# Patient Record
Sex: Female | Born: 1985 | Race: White | Hispanic: Yes | Marital: Single | Smoking: Never smoker
Health system: Southern US, Community
[De-identification: ages and names within clinical notes are randomized; demographics above are authoritative.]

## PROBLEM LIST (undated history)

## (undated) DIAGNOSIS — I10 Essential (primary) hypertension: Secondary | ICD-10-CM

## (undated) DIAGNOSIS — E119 Type 2 diabetes mellitus without complications: Secondary | ICD-10-CM

## (undated) DIAGNOSIS — F419 Anxiety disorder, unspecified: Secondary | ICD-10-CM

## (undated) DIAGNOSIS — L0291 Cutaneous abscess, unspecified: Secondary | ICD-10-CM

---

## 2020-07-16 ENCOUNTER — Encounter (HOSPITAL_COMMUNITY): Payer: Self-pay

## 2020-07-16 ENCOUNTER — Ambulatory Visit (HOSPITAL_COMMUNITY): Admission: EM | Admit: 2020-07-16 | Discharge: 2020-07-16 | Discharge status: Against Medical Advice | Payer: 59

## 2020-07-16 ENCOUNTER — Ambulatory Visit (HOSPITAL_COMMUNITY)
Admission: RE | Admit: 2020-07-16 | Discharge: 2020-07-16 | Disposition: A | Discharge status: Home / Self Care | Payer: 59 | Source: Ambulatory Visit | Attending: Urgent Care | Admitting: Urgent Care

## 2020-07-16 ENCOUNTER — Inpatient Hospital Stay (HOSPITAL_COMMUNITY)
Admission: EM | Admit: 2020-07-16 | Discharge: 2020-07-19 | DRG: 854 | Disposition: A | Discharge status: Home / Self Care | Payer: 59 | Attending: Internal Medicine | Admitting: Internal Medicine

## 2020-07-16 ENCOUNTER — Other Ambulatory Visit: Payer: Self-pay

## 2020-07-16 VITALS — BP 163/100 | HR 106 | Temp 99.0°F | Resp 18

## 2020-07-16 DIAGNOSIS — I10 Essential (primary) hypertension: Secondary | ICD-10-CM | POA: Diagnosis present

## 2020-07-16 DIAGNOSIS — L02215 Cutaneous abscess of perineum: Secondary | ICD-10-CM | POA: Diagnosis present

## 2020-07-16 DIAGNOSIS — L732 Hidradenitis suppurativa: Secondary | ICD-10-CM | POA: Diagnosis present

## 2020-07-16 DIAGNOSIS — E119 Type 2 diabetes mellitus without complications: Secondary | ICD-10-CM

## 2020-07-16 DIAGNOSIS — M542 Cervicalgia: Secondary | ICD-10-CM

## 2020-07-16 DIAGNOSIS — A419 Sepsis, unspecified organism: Secondary | ICD-10-CM | POA: Diagnosis not present

## 2020-07-16 DIAGNOSIS — Z6841 Body Mass Index (BMI) 40.0 and over, adult: Secondary | ICD-10-CM

## 2020-07-16 DIAGNOSIS — L0211 Cutaneous abscess of neck: Secondary | ICD-10-CM | POA: Diagnosis not present

## 2020-07-16 DIAGNOSIS — Z20822 Contact with and (suspected) exposure to covid-19: Secondary | ICD-10-CM | POA: Diagnosis present

## 2020-07-16 DIAGNOSIS — L03221 Cellulitis of neck: Secondary | ICD-10-CM | POA: Diagnosis not present

## 2020-07-16 DIAGNOSIS — E1165 Type 2 diabetes mellitus with hyperglycemia: Secondary | ICD-10-CM | POA: Diagnosis present

## 2020-07-16 DIAGNOSIS — R739 Hyperglycemia, unspecified: Secondary | ICD-10-CM

## 2020-07-16 DIAGNOSIS — Z87891 Personal history of nicotine dependence: Secondary | ICD-10-CM

## 2020-07-16 DIAGNOSIS — E1121 Type 2 diabetes mellitus with diabetic nephropathy: Secondary | ICD-10-CM

## 2020-07-16 HISTORY — DX: Essential (primary) hypertension: I10

## 2020-07-16 HISTORY — DX: Type 2 diabetes mellitus without complications: E11.9

## 2020-07-16 HISTORY — DX: Cutaneous abscess, unspecified: L02.91

## 2020-07-16 LAB — COMPREHENSIVE METABOLIC PANEL
ALT: 15 U/L (ref 0–44)
AST: 13 U/L — ABNORMAL LOW (ref 15–41)
Albumin: 3.1 g/dL — ABNORMAL LOW (ref 3.5–5.0)
Alkaline Phosphatase: 56 U/L (ref 38–126)
Anion gap: 10 (ref 5–15)
BUN: 8 mg/dL (ref 6–20)
CO2: 25 mmol/L (ref 22–32)
Calcium: 8.8 mg/dL — ABNORMAL LOW (ref 8.9–10.3)
Chloride: 100 mmol/L (ref 98–111)
Creatinine, Ser: 0.71 mg/dL (ref 0.44–1.00)
GFR calc Af Amer: >60 mL/min (ref >60)
GFR calc non Af Amer: >60 mL/min (ref >60)
Glucose, Bld: 163 mg/dL — ABNORMAL HIGH (ref 70–99)
Potassium: 4 mmol/L (ref 3.5–5.1)
Sodium: 135 mmol/L (ref 135–145)
Total Bilirubin: 0.6 mg/dL (ref 0.3–1.2)
Total Protein: 9 g/dL — ABNORMAL HIGH (ref 6.5–8.1)

## 2020-07-16 LAB — CBC WITH DIFFERENTIAL/PLATELET
Abs Immature Granulocytes: 0.07 10*3/uL (ref 0.00–0.07)
Basophils Absolute: 0.1 10*3/uL (ref 0.0–0.1)
Basophils Relative: 0 %
Eosinophils Absolute: 0.1 10*3/uL (ref 0.0–0.5)
Eosinophils Relative: 0 %
HCT: 40.2 % (ref 36.0–46.0)
Hemoglobin: 12.8 g/dL (ref 12.0–15.0)
Immature Granulocytes: 0 %
Lymphocytes Relative: 15 %
Lymphs Abs: 2.6 10*3/uL (ref 0.7–4.0)
MCH: 28.2 pg (ref 26.0–34.0)
MCHC: 31.8 g/dL (ref 30.0–36.0)
MCV: 88.5 fL (ref 80.0–100.0)
Monocytes Absolute: 1.1 10*3/uL — ABNORMAL HIGH (ref 0.1–1.0)
Monocytes Relative: 6 %
Neutro Abs: 13.6 10*3/uL — ABNORMAL HIGH (ref 1.7–7.7)
Neutrophils Relative %: 79 %
Platelets: 461 10*3/uL — ABNORMAL HIGH (ref 150–400)
RBC: 4.54 MIL/uL (ref 3.87–5.11)
RDW: 13 % (ref 11.5–15.5)
WBC: 17.5 10*3/uL — ABNORMAL HIGH (ref 4.0–10.5)
nRBC: 0 % (ref 0.0–0.2)

## 2020-07-16 LAB — LACTIC ACID, PLASMA: Lactic Acid, Venous: 0.9 mmol/L (ref 0.5–1.9)

## 2020-07-16 LAB — I-STAT BETA HCG BLOOD, ED (MC, WL, AP ONLY): I-stat hCG, quantitative: <5 m[IU]/mL (ref <5)

## 2020-07-16 MED ORDER — HYDROCODONE-ACETAMINOPHEN 5-325 MG PO TABS
ORAL_TABLET | ORAL | Status: AC
  Filled 2020-07-16: qty 1

## 2020-07-16 MED ORDER — HYDROCODONE-ACETAMINOPHEN 5-325 MG PO TABS
1.0000 | ORAL_TABLET | Freq: Once | ORAL | Status: AC
  Administered 2020-07-16: 1 via ORAL

## 2020-07-16 NOTE — ED Triage Notes (Signed)
Patient c/o abscess on the back of the neck.

## 2020-07-16 NOTE — ED Notes (Signed)
Patient is being discharged from the Urgent Care and sent to the Emergency Department via POV . Per Wallis Bamberg, PA, patient is in need of higher level of care due to HTN, tachycardia, large abscess on back of neck. Patient is aware and verbalizes understanding of plan of care.  Vitals:   07/16/20 1606  BP: (!) 163/100  Pulse: (!) 106  Resp: 18  Temp: 99 F (37.2 C)  SpO2: 100%   \

## 2020-07-16 NOTE — ED Triage Notes (Signed)
Pt arrives POV for eval of R sided neck abscess and pilonidal cyst requiring drainage. Pt reports hx of same. Sent here from UC for eval. Given Norco at UC, and took 1500mg  of tylenol this AM, so given amt of tylenol taken today, no more given in triage for low grade fever.

## 2020-07-16 NOTE — ED Provider Notes (Signed)
  [facility]   MRN: [id number] DOB: 1986/03/19  Subjective:   Alexis Mathews is a 34 y.o. female presenting for 1 week history of recurrent worsening abscess of the back of her neck.  States that the swelling has become very prominent and the pain is moderate to severe, constant.  Patient has previously had to have a right-sided neck abscess surgically drained.  Has taken Tylenol with minimal relief.  No current facility-administered medications for this encounter. No current outpatient medications on file.   No Known Allergies  Past Medical History:  Diagnosis Date  . Abscess   . Hypertension      History reviewed. No pertinent surgical history.  History reviewed. No pertinent family history.  Social History   Tobacco Use  . Smoking status: Never Smoker  . Smokeless tobacco: Never Used  Vaping Use  . Vaping Use: Never used  Substance Use Topics  . Alcohol use: Yes  . Drug use: Not on file    ROS   Objective:   Vitals: BP (!) 163/100   Pulse (!) 106   Temp 99 F (37.2 C)   Resp 18   LMP 07/02/2020 (Within Days)   SpO2 100%   Physical Exam Constitutional:      General: She is not in acute distress.    Appearance: Normal appearance. She is well-developed. She is not ill-appearing, toxic-appearing or diaphoretic.  HENT:     Head: Normocephalic and atraumatic.     Nose: Nose normal.     Mouth/Throat:     Mouth: Mucous membranes are moist.     Pharynx: Oropharynx is clear.  Eyes:     General: No scleral icterus.       Right eye: No discharge.        Left eye: No discharge.     Extraocular Movements: Extraocular movements intact.     Conjunctiva/sclera: Conjunctivae normal.     Pupils: Pupils are equal, round, and reactive to light.  Neck:   Cardiovascular:     Rate and Rhythm: Normal rate.  Pulmonary:     Effort: Pulmonary effort is normal.  Skin:    General: Skin is warm and dry.  Neurological:     General: No focal deficit present.      Mental Status: She is alert and oriented to person, place, and time.  Psychiatric:        Mood and Affect: Mood normal.        Behavior: Behavior normal.        Thought Content: Thought content normal.        Judgment: Judgment normal.      Assessment and Plan :   PDMP not reviewed this encounter.  1. Neck abscess   2. Neck pain     She has a severe neck abscess, has history of requiring surgical drainage for similar problem.  Given her tachycardia, lack of fluctuance, severity of her abscess and history will redirect patient to the emergency room for further management.  Patient given hydrocodone in clinic to bridge her treatment in the ER.  Contracts for safety and will drive there now.   Wallis Bamberg, [place] 07/16/20 1628

## 2020-07-16 NOTE — Discharge Instructions (Addendum)
You have a severe neck abscess, tachycardia and an elevated blood pressure reading that will require a higher level of care than we can provide in the urgent care setting. Please report to the emergency room now for further management.

## 2020-07-17 ENCOUNTER — Encounter (HOSPITAL_COMMUNITY)
Admission: EM | Disposition: A | Discharge status: Home / Self Care | Payer: 59 | Source: Home / Self Care | Attending: Family Medicine

## 2020-07-17 ENCOUNTER — Encounter (HOSPITAL_COMMUNITY): Payer: Self-pay | Admitting: Family Medicine

## 2020-07-17 ENCOUNTER — Inpatient Hospital Stay (HOSPITAL_COMMUNITY): Payer: 59 | Admitting: Certified Registered"

## 2020-07-17 ENCOUNTER — Encounter (INDEPENDENT_AMBULATORY_CARE_PROVIDER_SITE_OTHER): Payer: Self-pay

## 2020-07-17 ENCOUNTER — Emergency Department (HOSPITAL_COMMUNITY): Payer: 59

## 2020-07-17 DIAGNOSIS — L03221 Cellulitis of neck: Secondary | ICD-10-CM | POA: Diagnosis present

## 2020-07-17 DIAGNOSIS — L02215 Cutaneous abscess of perineum: Secondary | ICD-10-CM | POA: Diagnosis present

## 2020-07-17 DIAGNOSIS — L732 Hidradenitis suppurativa: Secondary | ICD-10-CM | POA: Diagnosis present

## 2020-07-17 DIAGNOSIS — L0211 Cutaneous abscess of neck: Secondary | ICD-10-CM

## 2020-07-17 DIAGNOSIS — A419 Sepsis, unspecified organism: Secondary | ICD-10-CM | POA: Diagnosis present

## 2020-07-17 DIAGNOSIS — R739 Hyperglycemia, unspecified: Secondary | ICD-10-CM | POA: Diagnosis present

## 2020-07-17 DIAGNOSIS — Z6841 Body Mass Index (BMI) 40.0 and over, adult: Secondary | ICD-10-CM | POA: Diagnosis not present

## 2020-07-17 DIAGNOSIS — E1165 Type 2 diabetes mellitus with hyperglycemia: Secondary | ICD-10-CM | POA: Diagnosis present

## 2020-07-17 DIAGNOSIS — E119 Type 2 diabetes mellitus without complications: Secondary | ICD-10-CM

## 2020-07-17 DIAGNOSIS — I10 Essential (primary) hypertension: Secondary | ICD-10-CM | POA: Diagnosis present

## 2020-07-17 DIAGNOSIS — Z87891 Personal history of nicotine dependence: Secondary | ICD-10-CM | POA: Diagnosis not present

## 2020-07-17 DIAGNOSIS — Z20822 Contact with and (suspected) exposure to covid-19: Secondary | ICD-10-CM | POA: Diagnosis present

## 2020-07-17 HISTORY — PX: IRRIGATION AND DEBRIDEMENT ABSCESS: SHX5252

## 2020-07-17 HISTORY — PX: INCISION AND DRAINAGE PERIRECTAL ABSCESS: SHX1804

## 2020-07-17 LAB — COMPREHENSIVE METABOLIC PANEL
ALT: 12 U/L (ref 0–44)
AST: 13 U/L — ABNORMAL LOW (ref 15–41)
Albumin: 2.6 g/dL — ABNORMAL LOW (ref 3.5–5.0)
Alkaline Phosphatase: 49 U/L (ref 38–126)
Anion gap: 9 (ref 5–15)
BUN: 6 mg/dL (ref 6–20)
CO2: 26 mmol/L (ref 22–32)
Calcium: 8.3 mg/dL — ABNORMAL LOW (ref 8.9–10.3)
Chloride: 99 mmol/L (ref 98–111)
Creatinine, Ser: 0.67 mg/dL (ref 0.44–1.00)
GFR calc Af Amer: >60 mL/min (ref >60)
GFR calc non Af Amer: >60 mL/min (ref >60)
Glucose, Bld: 126 mg/dL — ABNORMAL HIGH (ref 70–99)
Potassium: 3.7 mmol/L (ref 3.5–5.1)
Sodium: 134 mmol/L — ABNORMAL LOW (ref 135–145)
Total Bilirubin: 0.8 mg/dL (ref 0.3–1.2)
Total Protein: 8.1 g/dL (ref 6.5–8.1)

## 2020-07-17 LAB — CREATININE, SERUM
Creatinine, Ser: 0.75 mg/dL (ref 0.44–1.00)
GFR calc Af Amer: >60 mL/min (ref >60)
GFR calc non Af Amer: >60 mL/min (ref >60)

## 2020-07-17 LAB — HIV ANTIBODY (ROUTINE TESTING W REFLEX): HIV Screen 4th Generation wRfx: NONREACTIVE

## 2020-07-17 LAB — CBC WITH DIFFERENTIAL/PLATELET
Abs Immature Granulocytes: 0.06 10*3/uL (ref 0.00–0.07)
Basophils Absolute: 0.1 10*3/uL (ref 0.0–0.1)
Basophils Relative: 0 %
Eosinophils Absolute: 0.1 10*3/uL (ref 0.0–0.5)
Eosinophils Relative: 1 %
HCT: 37.9 % (ref 36.0–46.0)
Hemoglobin: 12 g/dL (ref 12.0–15.0)
Immature Granulocytes: 0 %
Lymphocytes Relative: 20 %
Lymphs Abs: 3.2 10*3/uL (ref 0.7–4.0)
MCH: 28.3 pg (ref 26.0–34.0)
MCHC: 31.7 g/dL (ref 30.0–36.0)
MCV: 89.4 fL (ref 80.0–100.0)
Monocytes Absolute: 1.1 10*3/uL — ABNORMAL HIGH (ref 0.1–1.0)
Monocytes Relative: 7 %
Neutro Abs: 11.5 10*3/uL — ABNORMAL HIGH (ref 1.7–7.7)
Neutrophils Relative %: 72 %
Platelets: 429 10*3/uL — ABNORMAL HIGH (ref 150–400)
RBC: 4.24 MIL/uL (ref 3.87–5.11)
RDW: 13.2 % (ref 11.5–15.5)
WBC: 16 10*3/uL — ABNORMAL HIGH (ref 4.0–10.5)
nRBC: 0 % (ref 0.0–0.2)

## 2020-07-17 LAB — LACTIC ACID, PLASMA
Lactic Acid, Venous: 1.4 mmol/L (ref 0.5–1.9)
Lactic Acid, Venous: 1.1 mmol/L (ref 0.5–1.9)

## 2020-07-17 LAB — GLUCOSE, CAPILLARY
Glucose-Capillary: 130 mg/dL — ABNORMAL HIGH (ref 70–99)
Glucose-Capillary: 232 mg/dL — ABNORMAL HIGH (ref 70–99)

## 2020-07-17 LAB — HEMOGLOBIN A1C
Hgb A1c MFr Bld: 6.8 % — ABNORMAL HIGH (ref 4.8–5.6)
Mean Plasma Glucose: 148.46 mg/dL

## 2020-07-17 LAB — SARS CORONAVIRUS 2 BY RT PCR (HOSPITAL ORDER, PERFORMED IN ~~LOC~~ HOSPITAL LAB): SARS Coronavirus 2: NEGATIVE

## 2020-07-17 LAB — CBG MONITORING, ED
Glucose-Capillary: 127 mg/dL — ABNORMAL HIGH (ref 70–99)
Glucose-Capillary: 135 mg/dL — ABNORMAL HIGH (ref 70–99)

## 2020-07-17 LAB — MAGNESIUM: Magnesium: 1.7 mg/dL (ref 1.7–2.4)

## 2020-07-17 SURGERY — IRRIGATION AND DEBRIDEMENT ABSCESS
Anesthesia: General | Site: Neck

## 2020-07-17 MED ORDER — SUCCINYLCHOLINE CHLORIDE 20 MG/ML IJ SOLN
INTRAMUSCULAR | Status: DC | PRN
Start: 2020-07-17 — End: 2020-07-17
  Administered 2020-07-17: 200 mg via INTRAVENOUS

## 2020-07-17 MED ORDER — ACETAMINOPHEN 500 MG PO TABS
1000.0000 mg | ORAL_TABLET | Freq: Once | ORAL | Status: AC
  Administered 2020-07-17: 1000 mg via ORAL
  Filled 2020-07-17: qty 2

## 2020-07-17 MED ORDER — LACTATED RINGERS IV SOLN: INTRAVENOUS | Status: DC

## 2020-07-17 MED ORDER — LIDOCAINE 2% (20 MG/ML) 5 ML SYRINGE
INTRAMUSCULAR | Status: AC
  Filled 2020-07-17: qty 5

## 2020-07-17 MED ORDER — INSULIN ASPART 100 UNIT/ML ~~LOC~~ SOLN: 0.0000 [IU] | Freq: Three times a day (TID) | SUBCUTANEOUS | Status: DC

## 2020-07-17 MED ORDER — MORPHINE SULFATE (PF) 4 MG/ML IV SOLN
4.0000 mg | Freq: Once | INTRAVENOUS | Status: AC
  Administered 2020-07-17: 4 mg via INTRAVENOUS
  Filled 2020-07-17: qty 1

## 2020-07-17 MED ORDER — OXYCODONE HCL 5 MG PO TABS
5.0000 mg | ORAL_TABLET | ORAL | Status: DC | PRN
  Administered 2020-07-19: 5 mg via ORAL
  Filled 2020-07-17: qty 1

## 2020-07-17 MED ORDER — ACETAMINOPHEN 650 MG RE SUPP: 650.0000 mg | Freq: Four times a day (QID) | RECTAL | Status: DC | PRN

## 2020-07-17 MED ORDER — ROCURONIUM BROMIDE 10 MG/ML (PF) SYRINGE
PREFILLED_SYRINGE | INTRAVENOUS | Status: DC | PRN
  Administered 2020-07-17 (×2): 20 mg via INTRAVENOUS

## 2020-07-17 MED ORDER — LIDOCAINE 2% (20 MG/ML) 5 ML SYRINGE
INTRAMUSCULAR | Status: DC | PRN
  Administered 2020-07-17: 100 mg via INTRAVENOUS

## 2020-07-17 MED ORDER — POLYETHYLENE GLYCOL 3350 17 G PO PACK: 17.0000 g | PACK | Freq: Every day | ORAL | Status: DC | PRN

## 2020-07-17 MED ORDER — MORPHINE SULFATE (PF) 2 MG/ML IV SOLN: 2.0000 mg | INTRAVENOUS | Status: DC | PRN

## 2020-07-17 MED ORDER — SUCCINYLCHOLINE CHLORIDE 200 MG/10ML IV SOSY
PREFILLED_SYRINGE | INTRAVENOUS | Status: AC
  Filled 2020-07-17: qty 10

## 2020-07-17 MED ORDER — SODIUM CHLORIDE 0.9 % IV BOLUS
500.0000 mL | Freq: Once | INTRAVENOUS | Status: AC
  Administered 2020-07-17: 500 mL via INTRAVENOUS

## 2020-07-17 MED ORDER — FENTANYL CITRATE (PF) 250 MCG/5ML IJ SOLN
INTRAMUSCULAR | Status: AC
  Filled 2020-07-17: qty 5

## 2020-07-17 MED ORDER — HEPARIN SODIUM (PORCINE) 5000 UNIT/ML IJ SOLN
5000.0000 [IU] | Freq: Three times a day (TID) | INTRAMUSCULAR | Status: DC
  Administered 2020-07-17: 5000 [IU] via SUBCUTANEOUS
  Filled 2020-07-17: qty 1

## 2020-07-17 MED ORDER — ONDANSETRON HCL 4 MG/2ML IJ SOLN: 4.0000 mg | Freq: Four times a day (QID) | INTRAMUSCULAR | Status: DC | PRN

## 2020-07-17 MED ORDER — HEPARIN SODIUM (PORCINE) 5000 UNIT/ML IJ SOLN
5000.0000 [IU] | Freq: Three times a day (TID) | INTRAMUSCULAR | Status: DC
  Administered 2020-07-18 – 2020-07-19 (×5): 5000 [IU] via SUBCUTANEOUS
  Filled 2020-07-17 (×5): qty 1

## 2020-07-17 MED ORDER — PROPOFOL 10 MG/ML IV BOLUS
INTRAVENOUS | Status: DC | PRN
  Administered 2020-07-17: 200 mg via INTRAVENOUS

## 2020-07-17 MED ORDER — METFORMIN HCL 500 MG PO TABS
500.0000 mg | ORAL_TABLET | Freq: Every day | ORAL | Status: DC
  Administered 2020-07-18 – 2020-07-19 (×2): 500 mg via ORAL
  Filled 2020-07-17 (×2): qty 1

## 2020-07-17 MED ORDER — 0.9 % SODIUM CHLORIDE (POUR BTL) OPTIME
TOPICAL | Status: DC | PRN
  Administered 2020-07-17: 1000 mL

## 2020-07-17 MED ORDER — LACTATED RINGERS IV BOLUS (SEPSIS)
600.0000 mL | Freq: Once | INTRAVENOUS | Status: AC
  Administered 2020-07-17: 600 mL via INTRAVENOUS

## 2020-07-17 MED ORDER — LACTATED RINGERS IV SOLN
INTRAVENOUS | Status: DC | PRN
Start: 2020-07-17 — End: 2020-07-17

## 2020-07-17 MED ORDER — CHLORHEXIDINE GLUCONATE 0.12 % MT SOLN
15.0000 mL | Freq: Once | OROMUCOSAL | Status: AC
  Administered 2020-07-17: 15 mL via OROMUCOSAL
  Filled 2020-07-17: qty 15

## 2020-07-17 MED ORDER — SODIUM CHLORIDE 0.9 % IV SOLN
2.0000 g | Freq: Once | INTRAVENOUS | Status: AC
  Administered 2020-07-17: 2 g via INTRAVENOUS
  Filled 2020-07-17: qty 20

## 2020-07-17 MED ORDER — MIDAZOLAM HCL 2 MG/2ML IJ SOLN
INTRAMUSCULAR | Status: AC
  Filled 2020-07-17: qty 2

## 2020-07-17 MED ORDER — ONDANSETRON HCL 4 MG/2ML IJ SOLN
INTRAMUSCULAR | Status: AC
  Filled 2020-07-17: qty 2

## 2020-07-17 MED ORDER — VANCOMYCIN HCL IN DEXTROSE 1-5 GM/200ML-% IV SOLN
1000.0000 mg | Freq: Once | INTRAVENOUS | Status: DC
  Filled 2020-07-17: qty 200

## 2020-07-17 MED ORDER — GLYCOPYRROLATE PF 0.2 MG/ML IJ SOSY
PREFILLED_SYRINGE | INTRAMUSCULAR | Status: AC
  Filled 2020-07-17: qty 1

## 2020-07-17 MED ORDER — VANCOMYCIN HCL IN DEXTROSE 1-5 GM/200ML-% IV SOLN: 1000.0000 mg | Freq: Once | INTRAVENOUS | Status: DC

## 2020-07-17 MED ORDER — FENTANYL CITRATE (PF) 100 MCG/2ML IJ SOLN
INTRAMUSCULAR | Status: DC | PRN
  Administered 2020-07-17 (×3): 50 ug via INTRAVENOUS
  Administered 2020-07-17: 100 ug via INTRAVENOUS

## 2020-07-17 MED ORDER — DEXAMETHASONE SODIUM PHOSPHATE 10 MG/ML IJ SOLN
INTRAMUSCULAR | Status: DC | PRN
  Administered 2020-07-17: 4 mg via INTRAVENOUS

## 2020-07-17 MED ORDER — HYDRALAZINE HCL 20 MG/ML IJ SOLN: 10.0000 mg | INTRAMUSCULAR | Status: DC | PRN

## 2020-07-17 MED ORDER — ONDANSETRON HCL 4 MG PO TABS: 4.0000 mg | ORAL_TABLET | Freq: Four times a day (QID) | ORAL | Status: DC | PRN

## 2020-07-17 MED ORDER — DEXAMETHASONE SODIUM PHOSPHATE 10 MG/ML IJ SOLN
INTRAMUSCULAR | Status: AC
  Filled 2020-07-17: qty 1

## 2020-07-17 MED ORDER — PROPOFOL 10 MG/ML IV BOLUS
INTRAVENOUS | Status: AC
  Filled 2020-07-17: qty 20

## 2020-07-17 MED ORDER — PHENYLEPHRINE 40 MCG/ML (10ML) SYRINGE FOR IV PUSH (FOR BLOOD PRESSURE SUPPORT)
PREFILLED_SYRINGE | INTRAVENOUS | Status: AC
  Filled 2020-07-17: qty 10

## 2020-07-17 MED ORDER — VANCOMYCIN HCL IN DEXTROSE 1-5 GM/200ML-% IV SOLN
1000.0000 mg | Freq: Three times a day (TID) | INTRAVENOUS | Status: DC
  Administered 2020-07-17 – 2020-07-19 (×6): 1000 mg via INTRAVENOUS
  Filled 2020-07-17 (×9): qty 200

## 2020-07-17 MED ORDER — MORPHINE SULFATE (PF) 2 MG/ML IV SOLN
1.0000 mg | INTRAVENOUS | Status: DC | PRN
  Administered 2020-07-18: 2 mg via INTRAVENOUS
  Filled 2020-07-17 (×2): qty 1

## 2020-07-17 MED ORDER — IOHEXOL 300 MG/ML  SOLN
75.0000 mL | Freq: Once | INTRAMUSCULAR | Status: AC | PRN
  Administered 2020-07-17: 75 mL via INTRAVENOUS

## 2020-07-17 MED ORDER — VANCOMYCIN HCL 2000 MG/400ML IV SOLN
2000.0000 mg | Freq: Once | INTRAVENOUS | Status: AC
  Administered 2020-07-17: 2000 mg via INTRAVENOUS
  Filled 2020-07-17: qty 400

## 2020-07-17 MED ORDER — MIDAZOLAM HCL 5 MG/5ML IJ SOLN
INTRAMUSCULAR | Status: DC | PRN
  Administered 2020-07-17: 2 mg via INTRAVENOUS

## 2020-07-17 MED ORDER — SUGAMMADEX SODIUM 200 MG/2ML IV SOLN
INTRAVENOUS | Status: DC | PRN
Start: 2020-07-17 — End: 2020-07-17
  Administered 2020-07-17: 100 mg via INTRAVENOUS

## 2020-07-17 MED ORDER — ROCURONIUM BROMIDE 10 MG/ML (PF) SYRINGE
PREFILLED_SYRINGE | INTRAVENOUS | Status: AC
  Filled 2020-07-17: qty 10

## 2020-07-17 MED ORDER — ACETAMINOPHEN 325 MG PO TABS
650.0000 mg | ORAL_TABLET | Freq: Three times a day (TID) | ORAL | Status: DC
  Administered 2020-07-17 – 2020-07-19 (×6): 650 mg via ORAL
  Filled 2020-07-17 (×6): qty 2

## 2020-07-17 MED ORDER — LACTATED RINGERS IV SOLN: INTRAVENOUS | Status: AC

## 2020-07-17 MED ORDER — LACTATED RINGERS IV BOLUS (SEPSIS)
1000.0000 mL | Freq: Once | INTRAVENOUS | Status: AC
  Administered 2020-07-17: 1000 mL via INTRAVENOUS

## 2020-07-17 MED ORDER — HEMOSTATIC AGENTS (NO CHARGE) OPTIME
TOPICAL | Status: DC | PRN
  Administered 2020-07-17 (×2): 1 via TOPICAL

## 2020-07-17 MED ORDER — ONDANSETRON HCL 4 MG/2ML IJ SOLN
INTRAMUSCULAR | Status: DC | PRN
  Administered 2020-07-17: 4 mg via INTRAVENOUS

## 2020-07-17 MED ORDER — ACETAMINOPHEN 325 MG PO TABS: 650.0000 mg | ORAL_TABLET | Freq: Four times a day (QID) | ORAL | Status: DC | PRN

## 2020-07-17 SURGICAL SUPPLY — 34 items
BNDG GAUZE ELAST 4 BULKY (GAUZE/BANDAGES/DRESSINGS) IMPLANT
CANISTER SUCT 3000ML PPV (MISCELLANEOUS) ×4 IMPLANT
COVER SURGICAL LIGHT HANDLE (MISCELLANEOUS) ×6 IMPLANT
COVER WAND RF STERILE (DRAPES) ×4 IMPLANT
DRAIN PENROSE 1/4X12 LTX STRL (WOUND CARE) ×2 IMPLANT
DRAPE LAPAROSCOPIC ABDOMINAL (DRAPES) ×4 IMPLANT
DRSG PAD ABDOMINAL 8X10 ST (GAUZE/BANDAGES/DRESSINGS) ×6 IMPLANT
ELECT REM PT RETURN 9FT ADLT (ELECTROSURGICAL) ×4
ELECTRODE REM PT RTRN 9FT ADLT (ELECTROSURGICAL) ×2 IMPLANT
GAUZE PACKING IODOFORM 1X5 (PACKING) ×4 IMPLANT
GAUZE SPONGE 4X4 12PLY STRL (GAUZE/BANDAGES/DRESSINGS) ×4 IMPLANT
GLOVE BIO SURGEON STRL SZ 6 (GLOVE) ×2 IMPLANT
GLOVE BIO SURGEON STRL SZ8 (GLOVE) ×4 IMPLANT
GLOVE BIOGEL PI IND STRL 8 (GLOVE) ×2 IMPLANT
GLOVE BIOGEL PI INDICATOR 8 (GLOVE) ×2
GOWN STRL REUS W/ TWL LRG LVL3 (GOWN DISPOSABLE) ×2 IMPLANT
GOWN STRL REUS W/ TWL XL LVL3 (GOWN DISPOSABLE) ×2 IMPLANT
GOWN STRL REUS W/TWL LRG LVL3 (GOWN DISPOSABLE) ×2
GOWN STRL REUS W/TWL XL LVL3 (GOWN DISPOSABLE) ×4
HEMOSTAT ARISTA ABSORB 3G PWDR (HEMOSTASIS) ×2 IMPLANT
KIT BASIN OR (CUSTOM PROCEDURE TRAY) ×4 IMPLANT
KIT TURNOVER KIT B (KITS) ×4 IMPLANT
MARKER SKIN DUAL TIP RULER LAB (MISCELLANEOUS) ×2 IMPLANT
NS IRRIG 1000ML POUR BTL (IV SOLUTION) ×4 IMPLANT
PACK GENERAL/GYN (CUSTOM PROCEDURE TRAY) ×4 IMPLANT
PAD ARMBOARD 7.5X6 YLW CONV (MISCELLANEOUS) ×4 IMPLANT
PENCIL SMOKE EVACUATOR (MISCELLANEOUS) ×4 IMPLANT
SUT ETHILON 2 0 FS 18 (SUTURE) ×2 IMPLANT
SWAB COLLECTION DEVICE MRSA (MISCELLANEOUS) ×4 IMPLANT
SWAB CULTURE ESWAB REG 1ML (MISCELLANEOUS) ×4 IMPLANT
SYR BULB EAR ULCER 3OZ GRN STR (SYRINGE) ×2 IMPLANT
SYR CONTROL 10ML LL (SYRINGE) ×2 IMPLANT
TOWEL GREEN STERILE (TOWEL DISPOSABLE) ×4 IMPLANT
TOWEL GREEN STERILE FF (TOWEL DISPOSABLE) ×4 IMPLANT

## 2020-07-17 NOTE — H&P (View-Only) (Signed)
Reason for Consult/Chief Complaint: abscess Consultant: Muthersbaugh, PA  Alexis Mathews is an 34 y.o. female.   HPI: 37F with a neck and groin abscess, both present x1 week causing her to present to [facility] 07/16/2020. Denies any treatment strategies at home, no antibiotics, no warm soaks, no efforts to attempt drainage. Reports spontaneous drainage of the groin abscess began while here in the ED. Reports prior history of abscesses, the neck location required in-office drainage at [facility] by Dr. Luisa Hart, the groin location always managed with conservative therapy at home. Denies medical problems, but documentation in the chart reflects a history of hypertension and she has been as high as 180s systolic while here in the ED. Reports a history of social smoking, stopped 10-15 years ago.   Past Medical History:  Diagnosis Date  . Abscess   . Hypertension     History reviewed. No pertinent surgical history.  History reviewed. No pertinent family history.  Social History:  reports that she has never smoked. She has never used smokeless tobacco. She reports current alcohol use. No history on file for drug use.  Allergies: No Known Allergies  Medications: I have reviewed the patient's current medications.  Results for orders placed or performed during the hospital encounter of 07/16/20 (from the past 48 hour(s))  Lactic acid, plasma     Status: None   Collection Time: 07/16/20  6:17 PM  Result Value Ref Range   Lactic Acid, Venous 0.9 0.5 - 1.9 mmol/L    Comment: Performed at [facility], [address] [place], [place] [address]  Comprehensive metabolic panel     Status: Abnormal   Collection Time: 07/16/20  6:17 PM  Result Value Ref Range   Sodium 135 135 - 145 mmol/L   Potassium 4.0 3.5 - 5.1 mmol/L   Chloride 100 98 - 111 mmol/L   CO2 25 22 - 32 mmol/L   Glucose, Bld 163 (H) 70 - 99 mg/dL    Comment: Glucose reference range applies only to samples taken after fasting for at least  8 hours.   BUN 8 6 - 20 mg/dL   Creatinine, Ser 1.51 0.44 - 1.00 mg/dL   Calcium 8.8 (L) 8.9 - 10.3 mg/dL   Total Protein 9.0 (H) 6.5 - 8.1 g/dL   Albumin 3.1 (L) 3.5 - 5.0 g/dL   AST 13 (L) 15 - 41 U/L   ALT 15 0 - 44 U/L   Alkaline Phosphatase 56 38 - 126 U/L   Total Bilirubin 0.6 0.3 - 1.2 mg/dL   GFR calc non Af Amer >60 >60 mL/min   GFR calc Af Amer >60 >60 mL/min   Anion gap 10 5 - 15    Comment: Performed at [facility], [address], [place], [place] [address]  CBC with Differential     Status: Abnormal   Collection Time: 07/16/20  6:17 PM  Result Value Ref Range   WBC 17.5 (H) 4.0 - 10.5 K/uL   RBC 4.54 3.87 - 5.11 MIL/uL   Hemoglobin 12.8 12.0 - 15.0 g/dL   HCT 73.7 36 - 46 %   MCV 88.5 80.0 - 100.0 fL   MCH 28.2 26.0 - 34.0 pg   MCHC 31.8 30.0 - 36.0 g/dL   RDW 10.6 26.9 - 48.5 %   Platelets 461 (H) 150 - 400 K/uL   nRBC 0.0 0.0 - 0.2 %   Neutrophils Relative % 79 %   Neutro Abs 13.6 (H)  1.7 - 7.7 K/uL   Lymphocytes Relative 15 %   Lymphs Abs 2.6 0.7 - 4.0 K/uL   Monocytes Relative 6 %   Monocytes Absolute 1.1 (H) 0 - 1 K/uL   Eosinophils Relative 0 %   Eosinophils Absolute 0.1 0 - 0 K/uL   Basophils Relative 0 %   Basophils Absolute 0.1 0 - 0 K/uL   Immature Granulocytes 0 %   Abs Immature Granulocytes 0.07 0.00 - 0.07 K/uL    Comment: Performed at [facility], [address] [place], [place] [address]  I-Stat beta hCG blood, ED     Status: None   Collection Time: 07/16/20  6:45 PM  Result Value Ref Range   I-stat hCG, quantitative <5.0 <5 mIU/mL   Comment 3            Comment:   GEST. AGE      CONC.  (mIU/mL)   <=1 WEEK        5 - 50     2 WEEKS       50 - 500     3 WEEKS       100 - 10,000     4 WEEKS     1,000 - 30,000        FEMALE AND NON-PREGNANT FEMALE:     LESS THAN 5 mIU/mL   Lactic acid, plasma     Status: None   Collection Time: 07/17/20  2:00 AM  Result Value Ref Range   Lactic Acid, Venous 1.4 0.5 - 1.9 mmol/L     Comment: Performed at [facility], [address] [place], [place] [address]  SARS Coronavirus 2 by RT PCR (hospital order, performed in [facility] hospital lab) Nasopharyngeal Nasopharyngeal Swab     Status: None   Collection Time: 07/17/20  3:40 AM   Specimen: Nasopharyngeal Swab  Result Value Ref Range   SARS Coronavirus 2 NEGATIVE NEGATIVE    Comment: (NOTE) SARS-CoV-2 target nucleic acids are NOT DETECTED.  The SARS-CoV-2 RNA is generally detectable in upper and lower respiratory specimens during the acute phase of infection. The lowest concentration of SARS-CoV-2 viral copies this assay can detect is 250 copies / mL. A negative result does not preclude SARS-CoV-2 infection and should not be used as the sole basis for treatment or other patient management decisions.  A negative result may occur with improper specimen collection / handling, submission of specimen other than nasopharyngeal swab, presence of viral mutation(s) within the areas targeted by this assay, and inadequate number of viral copies (<250 copies / mL). A negative result must be combined with clinical observations, patient history, and epidemiological information.  Fact Sheet for Patients:   BoilerBrush.com.cy  Fact Sheet for Healthcare Providers: https://pope.com/  This test is not yet approved or  cleared by the [place] FDA and has been authorized for detection and/or diagnosis of SARS-CoV-2 by FDA under an Emergency Use Authorization (EUA).  This EUA will remain in effect (meaning this test can be used) for the duration of the COVID-19 declaration under Section 564(b)(1) of the Act, 21 U.S.C. section 360bbb-3(b)(1), unless the authorization is terminated or revoked sooner.  Performed at [facility], [address] [place], [place] [address]     CT Soft Tissue Neck W Contrast  Result Date: 07/17/2020 CLINICAL DATA:  Initial  evaluation for right-sided neck abscess. EXAM: CT NECK WITH CONTRAST TECHNIQUE: Multidetector CT imaging of the neck was performed using  the standard protocol following the bolus administration of intravenous contrast. CONTRAST:  14mL OMNIPAQUE IOHEXOL 300 MG/ML  SOLN COMPARISON:  None. FINDINGS: Pharynx and larynx: Oral cavity within normal limits. Few scattered dental caries noted without acute inflammatory changes about the teeth. Palatine tonsils symmetric and within normal limits. Parapharyngeal fat maintained. Remainder of the oropharynx and nasopharynx within normal limits. No retropharyngeal collection. Normal epiglottis. Remainder of the hypopharynx and supraglottic larynx within normal limits. True cords symmetric and normal. Subglottic airway clear. Salivary glands: Salivary glands including the parotid and submandibular glands are normal. Thyroid: Normal. Lymph nodes: Enlarged bilateral posterior chain/level 5 lymph nodes noted bilaterally, measuring up to 15 mm on the right and 14 mm on the left. Prominent supraclavicular nodes measure up to 10 mm. Findings presumably reactive. No other adenopathy within the neck. Vascular: Normal intravascular enhancement seen within the neck. Limited intracranial: Unremarkable. Visualized orbits: Unremarkable. Mastoids and visualized paranasal sinuses: Visualized paranasal sinuses are largely clear. Visualize mastoids and middle ear cavities are well pneumatized and free of fluid. Skeleton: No acute osseous finding. No discrete or worrisome osseous lesions. Upper chest: Visualized upper chest demonstrates no acute finding. Partially visualized lungs are grossly clear. Other: There is an irregular multiloculated rim enhancing collection positioned within the central and right posterior neck, consistent with abscess. Collection measures 8.5 x 4.9 x 4.2 cm in greatest transaxial dimensions (transverse by AP by craniocaudad). Surrounding inflammatory stranding within the  adjacent subcutaneous fat with overlying skin thickening, compatible with regional cellulitis. Stranding involves the underlying posterior paraspinous musculature. No extension into the deeper spaces of the neck or spinal canal at this time. IMPRESSION: 1. 8.5 x 4.9 x 4.2 cm multiloculated rim enhancing collection within the central and right posterior neck, consistent with abscess. Surrounding inflammatory stranding within the adjacent subcutaneous fat consistent with regional cellulitis. 2. Enlarged bilateral posterior chain/level 5 lymph nodes, presumably reactive. Electronically Signed   By: Rise Mu M.D.   On: 07/17/2020 02:43    ROS 10 point review of systems is negative except as listed above in HPI.   Physical Exam Blood pressure (!) 161/86, pulse 90, temperature 99.5 F (37.5 C), temperature source Oral, resp. rate 18, height 5\' 3"  (1.6 m), weight (!) 129.3 kg, last menstrual period 07/02/2020, SpO2 94 %. Constitutional: well-developed, well-nourished HEENT: pupils equal, round, reactive to light, 80mm b/l, moist conjunctiva, external inspection of ears and nose normal, hearing intact Oropharynx: normal oropharyngeal mucosa, normal dentition Neck: no thyromegaly, trachea midline, no midline cervical tenderness to palpation Chest: breath sounds equal bilaterally, normal respiratory effort, no midline or lateral chest wall tenderness to palpation/deformity Abdomen: soft, NT, no bruising, no hepatosplenomegaly GU: normal female genitalia  Back: no wounds, no thoracic/lumbar spine tenderness to palpation, no thoracic/lumbar spine stepoffs Rectal: deferred Extremities: 2+ radial and pedal pulses bilaterally, motor and sensation intact to bilateral UE and LE, no peripheral edema MSK: normal gait/station, no clubbing/cyanosis of fingers/toes, normal ROM of all four extremities Skin: warm, dry, no rashes Psych: normal memory, normal mood/affect    Assessment/Plan: 41F with neck  and L groin abscesses  Will likely need OR for drainage of neck abscess and EUA with probable drainage of L groin abscess, but will defer to day team. Informed consent was obtained after detailed explanation of risks, including bleeding, infection, risk of injury to surrounding structures, recurrence, and incomplete drainage. All questions answered to the patient's satisfaction.   3m, MD General and Trauma Surgery [facility]

## 2020-07-17 NOTE — ED Notes (Signed)
Op-consent signed at bedside.  

## 2020-07-17 NOTE — Anesthesia Preprocedure Evaluation (Signed)
Anesthesia Evaluation  Patient identified by MRN, date of birth, ID band Patient awake    Reviewed: Allergy & Precautions, NPO status , Patient's Chart, lab work & pertinent test results  History of Anesthesia Complications Negative for: history of anesthetic complications  Airway Mallampati: II  TM Distance: >3 FB Neck ROM: Full    Dental  (+) Teeth Intact   Pulmonary neg pulmonary ROS,    Pulmonary exam normal        Cardiovascular hypertension, Normal cardiovascular exam     Neuro/Psych negative neurological ROS  negative psych ROS   GI/Hepatic negative GI ROS, Neg liver ROS,   Endo/Other  diabetes (new diagnosis, untreated)Morbid obesity  Renal/GU negative Renal ROS  negative genitourinary   Musculoskeletal negative musculoskeletal ROS (+)   Abdominal   Peds  Hematology negative hematology ROS (+)   Anesthesia Other Findings   Reproductive/Obstetrics                             Anesthesia Physical Anesthesia Plan  ASA: III  Anesthesia Plan: General   Post-op Pain Management:    Induction: Intravenous  PONV Risk Score and Plan: 3 and Ondansetron, Dexamethasone, Treatment may vary due to age or medical condition and Midazolam  Airway Management Planned: Oral ETT  Additional Equipment: None  Intra-op Plan:   Post-operative Plan: Extubation in OR  Informed Consent: I have reviewed the patients History and Physical, chart, labs and discussed the procedure including the risks, benefits and alternatives for the proposed anesthesia with the patient or authorized representative who has indicated his/her understanding and acceptance.     Dental advisory given  Plan Discussed with:   Anesthesia Plan Comments:         Anesthesia Quick Evaluation

## 2020-07-17 NOTE — Anesthesia Procedure Notes (Signed)
Procedure Name: Intubation Date/Time: 07/17/2020 4:23 PM Performed by: Moshe Salisbury, CRNA Pre-anesthesia Checklist: Patient identified, Emergency Drugs available, Suction available and Patient being monitored Patient Re-evaluated:Patient Re-evaluated prior to induction Oxygen Delivery Method: Circle System Utilized Preoxygenation: Pre-oxygenation with 100% oxygen Induction Type: IV induction Ventilation: Mask ventilation without difficulty Laryngoscope Size: Mac and 3 Grade View: Grade II Tube type: Oral Tube size: 7.5 mm Number of attempts: 1 Airway Equipment and Method: Stylet Placement Confirmation: ETT inserted through vocal cords under direct vision,  positive ETCO2 and breath sounds checked- equal and bilateral Secured at: 22 cm Tube secured with: Tape Dental Injury: Teeth and Oropharynx as per pre-operative assessment

## 2020-07-17 NOTE — Transfer of Care (Signed)
Immediate Anesthesia Transfer of Care Note  Patient: Alexis Mathews  Procedure(s) Performed: IRRIGATION AND DEBRIDEMENT NECK ABSCESS (N/A Neck) IRRIGATION AND DEBRIDEMENT GROIN ABSCESS (N/A Groin)  Patient Location: PACU  Anesthesia Type:General  Level of Consciousness: drowsy and patient cooperative  Airway & Oxygen Therapy: Patient Spontanous Breathing and Patient connected to nasal cannula oxygen  Post-op Assessment: Report given to RN, Post -op Vital signs reviewed and stable and Patient moving all extremities  Post vital signs: Reviewed and stable  Last Vitals:  Vitals Value Taken Time  BP 140/79 07/17/20 1745  Temp 36.1 C 07/17/20 1745  Pulse 100 07/17/20 1750  Resp 32 07/17/20 1750  SpO2 100 % 07/17/20 1750  Vitals shown include unvalidated device data.  Last Pain:  Vitals:   07/17/20 1745  TempSrc:   PainSc: (P) 0-No pain         Complications: No complications documented.

## 2020-07-17 NOTE — Progress Notes (Signed)
Pharmacy Antibiotic Note  Alexis Mathews is a 34 y.o. female admitted on 07/16/2020 with neck abscess.  Pharmacy has been consulted for Vancomycin dosing. CT confirms abscess.   Plan: Vancomycin 2000 mg IV x 1, then 1000 mg IV q8h Trend WBC, temp, renal function  F/U infectious work-up Drug levels as indicated   Height: 5\' 3"  (160 cm) Weight: (!) 129.3 kg (285 lb) IBW/kg (Calculated) : 52.4  Temp (24hrs), Avg:99.2 F (37.3 C), Min:98.2 F (36.8 C), Max:100.7 F (38.2 C)  Recent Labs  Lab 07/16/20 1817 07/17/20 0200  WBC 17.5*  --   CREATININE 0.71  --   LATICACIDVEN 0.9 1.4    Estimated Creatinine Clearance: 128.9 mL/min (by C-G formula based on SCr of 0.71 mg/dL).    No Known Allergies  09/16/20, PharmD, BCPS Clinical Pharmacist Phone: [phone]

## 2020-07-17 NOTE — Progress Notes (Signed)
Communication took place with bedside  RN, RN and MD are aware of BC and Abx timing. Lactic normal, HTN. Pt in ED again with similiar adm diagnosis.

## 2020-07-17 NOTE — Brief Op Note (Signed)
07/17/2020  5:27 PM  PATIENT:  Alexis Mathews  34 y.o. female  PRE-OPERATIVE DIAGNOSIS:  RIGHT POSTERIOR NECK ABSCESS, LEFT PERINEAL ABSCESS  POST-OPERATIVE DIAGNOSIS:   SAME, HIDRADENITIS OF PERINEUM  PROCEDURE:  Procedure(s): INCISION AND DRAINAGE AND EXCISIONAL DEBRIDEMENT OF RIGHT POSTERIOR NECK ABSCESS WITH SCALPEL  INCISION AND DRAINAGE OF LEFT PERINEAL ABSCESS/HIDRADENITIS   SURGEON:  Surgeon(s) and Role:    * Gaynelle Adu, MD - Primary  PHYSICIAN ASSISTANT:   ASSISTANTS: none   ANESTHESIA:   general  EBL:  <30CC  BLOOD ADMINISTERED:none  DRAINS: Penrose drain in the RIGHT POSTERIOR NECK ABSCESS CAVITY     Tool used for debridement (curette, scapel, etc.) scalpel    Frequency of surgical debridement.   Initial   Measurement of total devitalized tissue (wound surface) before and after surgical debridement.   There is no open wound in either location at the beginning of the procedure.  Right posterior neck wound measured 4 cm long by 2 cm wide by 5 cm deep.  It did tunnel medially as well as cephalad for about 4 cm in both directions.  The left perineal wound measured 3 cm long by about 2 cm deep.    Area and depth of devitalized tissue removed from wound.  In the right posterior neck skin, deep dermis and soft tissue were excised.  Wound dimensions above.    Blood loss and description of tissue removed.  30 cc.  In the right neck skin, deep dermis and soft tissue was excised.  In the left perineal region there was no skin that was excised.  There was about 2 cm of soft tissue that was necrotic that was excised   Was there any viable tissue removed (measurements): In the right neck there was an area of 4 cm of skin by 2 cm that was excised that was viable in order to get down to the abscess cavity    LOCAL MEDICATIONS USED:  NONE  SPECIMEN:  Source of Specimen:  RIGHT POSTERIOR NECK ABSCESS; LEFT PERINEAL ABSCESS  DISPOSITION OF SPECIMEN:  MICRO  COUNTS:   YES  TOURNIQUET:  * No tourniquets in log *  DICTATION: .Other Dictation: Dictation Number [id number]  PLAN OF CARE: Admit to inpatient   PATIENT DISPOSITION:  PACU - hemodynamically stable.   Delay start of Pharmacological VTE agent (>24hrs) due to surgical blood loss or risk of bleeding: no  [name]. Andrey Campanile, MD, FACS General, Bariatric, & Minimally Invasive Surgery [facility], [place]

## 2020-07-17 NOTE — Interval H&P Note (Signed)
History and Physical Interval Note:  07/17/2020 9:01 AM  Alexis Mathews  has presented today for surgery, with the diagnosis of NECK ABCESS.  The various methods of treatment have been discussed with the patient and family. After consideration of risks, benefits and other options for treatment, the patient has consented to  Procedure(s): IRRIGATION AND DEBRIDEMENT NECK ABSCESS AND GROIN (N/A) as a surgical intervention.  The patient's history has been reviewed, patient examined, no change in status, stable for surgery.  I have reviewed the patient's chart and labs.  Questions were answered to the patient's satisfaction.    Patient seen and examined.  Chart reviewed. Large right posterior neck cellulitis and tenderness Large left perineal/lower buttock abscess with bruising, some spontaneous seropurulent with large amount of induration  The right neck posterior abscess definitely needs incision and drainage and possible excisional debridement.  The large left perineal lower buttock area even though it is draining it is not adequately draining.  I believe this area needs to be opened up as well  There was a chaperone present  Recommended proceeding to the OR later today Discussed risk and benefits of the procedure including but not limited to bleeding, infection, recurrence, need for additional procedures, scarring, perioperative cardiac and pulmonary events, blood clots, delayed wound healing.  We did discuss the typical hospitalization as well as the typical recovery.  We discussed that this would need to heal from inside out.  May be a candidate for a wound VAC at some point.  Discussed that this would need dressing changes.  I answered all of her questions  Arne Cleveland. Andrey Campanile, MD, FACS General, Bariatric, & Minimally Invasive Surgery [facility], [place]

## 2020-07-17 NOTE — Progress Notes (Addendum)
Inpatient Diabetes Program Recommendations  AACE/ADA: New Consensus Statement on Inpatient Glycemic Control (2015)  Target Ranges:  Prepandial:   less than 140 mg/dL      Peak postprandial:   less than 180 mg/dL (1-2 hours)      Critically ill patients:  140 - 180 mg/dL   Results for ROCSI, HAZELBAKER (MRN [id number]) as of 07/17/2020 11:58  Ref. Range 07/17/2020 11:46  Glucose-Capillary Latest Ref Range: 70 - 99 mg/dL 127 (H)   Results for VANIYAH, LANSKY (MRN [id number]) as of 07/17/2020 11:58  Ref. Range 07/17/2020 07:14  Hemoglobin A1C Latest Ref Range: 4.8 - 5.6 % 6.8 (H)  (148 mg/dl)    To ED with Cellulitis and Abscess of Neck/ Groin  New Diagnosis of Diabetes this Admission per MD notes  Current Insulin Orders: Novolog Sensitive Correction Scale/ SSI (0-9 units) TID AC     Needs I&D per surgical team  Note new diagnosis of diabetes.  Will order educational materials for patient and speak with her regarding her new diagnosis.  MD- Do note see that patient has PCP listed.  Plan to place [facility] consult to get help with finding PCP for after d/c for further diabetes management.   Addendum 12:20pm--Met with pt down in the ED.  Pt is awaiting I&D today.  Pt told me that none of the doctors have told her she has diabetes yet.  We discussed her A1c and mild glucose elevations of admission.  Explained what an A1c is and what it measures.  Explained to pt that typically an A1c of 6.5% or higher is a positive diagnosis but that I will ask the Attending MD to please address with her whether or not this is new diagnosis of diabetes or pre-diabetes.  We also reviewed basic Diabetes pathophysiology.  Pt told me her grandfather may have had diabetes but that no one else in her family has diabetes that she knows of.  Discussed with pt given her weight and increasing age that she could be at risk for diabetes.  We also discussed how her current infection could be driving her elevated A1c and sugar levels.   Discussed with pt that modest weight loss and nutritional changes could help with sugar control as well.  Pt told me she drinks a lot of cranberry juice and an occasional regular soda.  Is interested in possibly pursuing gastric bypass.  We discussed how to modify nutrition at home to help better manage CBGs including elimination of carbs from beverages and reduction of carbs with meals.  We discussed what foods have carbs and I reviewed basic diabetes meal planning with pt as well.  I went ahead and gave pt 3 educational handouts on what diabetes is, how to check CBGs at home, and Basic Carbohydrate Counting.    We also discussed need for regular follow up care with PCP after discharge.  Pt stated she has been meaning to find PCP but has not yet done so.  Feels comfortable looking into her health insurance plan to find PCP.  Plan to revisit with pt tomorrow to see if she has any additional needs.     --Will follow patient during hospitalization--  Wyn Quaker RN, MSN, CDE Diabetes Coordinator Inpatient Glycemic Control Team Team Pager: [phone] (8a-5p)

## 2020-07-17 NOTE — Consult Note (Signed)
  Reason for Consult/Chief Complaint: abscess Consultant: Muthersbaugh, PA  Alexis Mathews is an 34 y.o. female.   HPI: 34F with a neck and groin abscess, both present x1 week causing her to present to UC 07/16/2020. Denies any treatment strategies at home, no antibiotics, no warm soaks, no efforts to attempt drainage. Reports spontaneous drainage of the groin abscess began while here in the ED. Reports prior history of abscesses, the neck location required in-office drainage at [facility] by Dr. Cornett, the groin location always managed with conservative therapy at home. Denies medical problems, but documentation in the chart reflects a history of hypertension and she has been as high as 180s systolic while here in the ED. Reports a history of social smoking, stopped 10-15 years ago.   Past Medical History:  Diagnosis Date  . Abscess   . Hypertension     History reviewed. No pertinent surgical history.  History reviewed. No pertinent family history.  Social History:  reports that she has never smoked. She has never used smokeless tobacco. She reports current alcohol use. No history on file for drug use.  Allergies: No Known Allergies  Medications: I have reviewed the patient's current medications.  Results for orders placed or performed during the hospital encounter of 07/16/20 (from the past 48 hour(s))  Lactic acid, plasma     Status: None   Collection Time: 07/16/20  6:17 PM  Result Value Ref Range   Lactic Acid, Venous 0.9 0.5 - 1.9 mmol/L    Comment: Performed at [facility], [address] [place], [place] [address]  Comprehensive metabolic panel     Status: Abnormal   Collection Time: 07/16/20  6:17 PM  Result Value Ref Range   Sodium 135 135 - 145 mmol/L   Potassium 4.0 3.5 - 5.1 mmol/L   Chloride 100 98 - 111 mmol/L   CO2 25 22 - 32 mmol/L   Glucose, Bld 163 (H) 70 - 99 mg/dL    Comment: Glucose reference range applies only to samples taken after fasting for at least  8 hours.   BUN 8 6 - 20 mg/dL   Creatinine, Ser 0.71 0.44 - 1.00 mg/dL   Calcium 8.8 (L) 8.9 - 10.3 mg/dL   Total Protein 9.0 (H) 6.5 - 8.1 g/dL   Albumin 3.1 (L) 3.5 - 5.0 g/dL   AST 13 (L) 15 - 41 U/L   ALT 15 0 - 44 U/L   Alkaline Phosphatase 56 38 - 126 U/L   Total Bilirubin 0.6 0.3 - 1.2 mg/dL   GFR calc non Af Amer >60 >60 mL/min   GFR calc Af Amer >60 >60 mL/min   Anion gap 10 5 - 15    Comment: Performed at  Hospital Lab, [address] [place], [place] [address]  CBC with Differential     Status: Abnormal   Collection Time: 07/16/20  6:17 PM  Result Value Ref Range   WBC 17.5 (H) 4.0 - 10.5 K/uL   RBC 4.54 3.87 - 5.11 MIL/uL   Hemoglobin 12.8 12.0 - 15.0 g/dL   HCT 40.2 36 - 46 %   MCV 88.5 80.0 - 100.0 fL   MCH 28.2 26.0 - 34.0 pg   MCHC 31.8 30.0 - 36.0 g/dL   RDW 13.0 11.5 - 15.5 %   Platelets 461 (H) 150 - 400 K/uL   nRBC 0.0 0.0 - 0.2 %   Neutrophils Relative % 79 %   Neutro Abs 13.6 (H)   1.7 - 7.7 K/uL   Lymphocytes Relative 15 %   Lymphs Abs 2.6 0.7 - 4.0 K/uL   Monocytes Relative 6 %   Monocytes Absolute 1.1 (H) 0 - 1 K/uL   Eosinophils Relative 0 %   Eosinophils Absolute 0.1 0 - 0 K/uL   Basophils Relative 0 %   Basophils Absolute 0.1 0 - 0 K/uL   Immature Granulocytes 0 %   Abs Immature Granulocytes 0.07 0.00 - 0.07 K/uL    Comment: Performed at [facility], [address] [place], [place] [address]  I-Stat beta hCG blood, ED     Status: None   Collection Time: 07/16/20  6:45 PM  Result Value Ref Range   I-stat hCG, quantitative <5.0 <5 mIU/mL   Comment 3            Comment:   GEST. AGE      CONC.  (mIU/mL)   <=1 WEEK        5 - 50     2 WEEKS       50 - 500     3 WEEKS       100 - 10,000     4 WEEKS     1,000 - 30,000        FEMALE AND NON-PREGNANT FEMALE:     LESS THAN 5 mIU/mL   Lactic acid, plasma     Status: None   Collection Time: 07/17/20  2:00 AM  Result Value Ref Range   Lactic Acid, Venous 1.4 0.5 - 1.9 mmol/L     Comment: Performed at [facility], [address] [place], [place] [address]  SARS Coronavirus 2 by RT PCR (hospital order, performed in [facility] hospital lab) Nasopharyngeal Nasopharyngeal Swab     Status: None   Collection Time: 07/17/20  3:40 AM   Specimen: Nasopharyngeal Swab  Result Value Ref Range   SARS Coronavirus 2 NEGATIVE NEGATIVE    Comment: (NOTE) SARS-CoV-2 target nucleic acids are NOT DETECTED.  The SARS-CoV-2 RNA is generally detectable in upper and lower respiratory specimens during the acute phase of infection. The lowest concentration of SARS-CoV-2 viral copies this assay can detect is 250 copies / mL. A negative result does not preclude SARS-CoV-2 infection and should not be used as the sole basis for treatment or other patient management decisions.  A negative result may occur with improper specimen collection / handling, submission of specimen other than nasopharyngeal swab, presence of viral mutation(s) within the areas targeted by this assay, and inadequate number of viral copies (<250 copies / mL). A negative result must be combined with clinical observations, patient history, and epidemiological information.  Fact Sheet for Patients:   BoilerBrush.com.cy  Fact Sheet for Healthcare Providers: https://pope.com/  This test is not yet approved or  cleared by the [place] FDA and has been authorized for detection and/or diagnosis of SARS-CoV-2 by FDA under an Emergency Use Authorization (EUA).  This EUA will remain in effect (meaning this test can be used) for the duration of the COVID-19 declaration under Section 564(b)(1) of the Act, 21 U.S.C. section 360bbb-3(b)(1), unless the authorization is terminated or revoked sooner.  Performed at [facility], [address] [place], [place] [address]     CT Soft Tissue Neck W Contrast  Result Date: 07/17/2020 CLINICAL DATA:  Initial  evaluation for right-sided neck abscess. EXAM: CT NECK WITH CONTRAST TECHNIQUE: Multidetector CT imaging of the neck was performed using  the standard protocol following the bolus administration of intravenous contrast. CONTRAST:  14mL OMNIPAQUE IOHEXOL 300 MG/ML  SOLN COMPARISON:  None. FINDINGS: Pharynx and larynx: Oral cavity within normal limits. Few scattered dental caries noted without acute inflammatory changes about the teeth. Palatine tonsils symmetric and within normal limits. Parapharyngeal fat maintained. Remainder of the oropharynx and nasopharynx within normal limits. No retropharyngeal collection. Normal epiglottis. Remainder of the hypopharynx and supraglottic larynx within normal limits. True cords symmetric and normal. Subglottic airway clear. Salivary glands: Salivary glands including the parotid and submandibular glands are normal. Thyroid: Normal. Lymph nodes: Enlarged bilateral posterior chain/level 5 lymph nodes noted bilaterally, measuring up to 15 mm on the right and 14 mm on the left. Prominent supraclavicular nodes measure up to 10 mm. Findings presumably reactive. No other adenopathy within the neck. Vascular: Normal intravascular enhancement seen within the neck. Limited intracranial: Unremarkable. Visualized orbits: Unremarkable. Mastoids and visualized paranasal sinuses: Visualized paranasal sinuses are largely clear. Visualize mastoids and middle ear cavities are well pneumatized and free of fluid. Skeleton: No acute osseous finding. No discrete or worrisome osseous lesions. Upper chest: Visualized upper chest demonstrates no acute finding. Partially visualized lungs are grossly clear. Other: There is an irregular multiloculated rim enhancing collection positioned within the central and right posterior neck, consistent with abscess. Collection measures 8.5 x 4.9 x 4.2 cm in greatest transaxial dimensions (transverse by AP by craniocaudad). Surrounding inflammatory stranding within the  adjacent subcutaneous fat with overlying skin thickening, compatible with regional cellulitis. Stranding involves the underlying posterior paraspinous musculature. No extension into the deeper spaces of the neck or spinal canal at this time. IMPRESSION: 1. 8.5 x 4.9 x 4.2 cm multiloculated rim enhancing collection within the central and right posterior neck, consistent with abscess. Surrounding inflammatory stranding within the adjacent subcutaneous fat consistent with regional cellulitis. 2. Enlarged bilateral posterior chain/level 5 lymph nodes, presumably reactive. Electronically Signed   By: Rise Mu M.D.   On: 07/17/2020 02:43    ROS 10 point review of systems is negative except as listed above in HPI.   Physical Exam Blood pressure (!) 161/86, pulse 90, temperature 99.5 F (37.5 C), temperature source Oral, resp. rate 18, height 5\' 3"  (1.6 m), weight (!) 129.3 kg, last menstrual period 07/02/2020, SpO2 94 %. Constitutional: well-developed, well-nourished HEENT: pupils equal, round, reactive to light, 80mm b/l, moist conjunctiva, external inspection of ears and nose normal, hearing intact Oropharynx: normal oropharyngeal mucosa, normal dentition Neck: no thyromegaly, trachea midline, no midline cervical tenderness to palpation Chest: breath sounds equal bilaterally, normal respiratory effort, no midline or lateral chest wall tenderness to palpation/deformity Abdomen: soft, NT, no bruising, no hepatosplenomegaly GU: normal female genitalia  Back: no wounds, no thoracic/lumbar spine tenderness to palpation, no thoracic/lumbar spine stepoffs Rectal: deferred Extremities: 2+ radial and pedal pulses bilaterally, motor and sensation intact to bilateral UE and LE, no peripheral edema MSK: normal gait/station, no clubbing/cyanosis of fingers/toes, normal ROM of all four extremities Skin: warm, dry, no rashes Psych: normal memory, normal mood/affect    Assessment/Plan: 41F with neck  and L groin abscesses  Will likely need OR for drainage of neck abscess and EUA with probable drainage of L groin abscess, but will defer to day team. Informed consent was obtained after detailed explanation of risks, including bleeding, infection, risk of injury to surrounding structures, recurrence, and incomplete drainage. All questions answered to the patient's satisfaction.   3m, MD General and Trauma Surgery [facility]

## 2020-07-17 NOTE — Op Note (Signed)
Alexis Mathews, Mathews MEDICAL RECORD [id number] ACCOUNT [id number] DATE OF BIRTH:12-12-1986 FACILITY: [facility] LOCATION: [facility] PHYSICIAN:Wael Maestas Elson Clan, MD  OPERATIVE REPORT  DATE OF PROCEDURE:  07/17/2020  PREOPERATIVE DIAGNOSES:   1.  Right posterior neck abscess. 2.  Left perineal abscess.  POSTOPERATIVE DIAGNOSES:   1.  Right posterior neck abscess. 2.  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  PROCEDURE:   1.  Incision and drainage and excisional debridement of right posterior neck abscess with scalpel. 2.  Incision and drainage of left perineal abscess/hidradenitis.  SURGEON:  Gaynelle Adu, MD   ANESTHESIA:  General.  ESTIMATED BLOOD LOSS:  Less than 30 mL.  SPECIMENS:  Aerobic and anaerobic cultures of the right posterior neck abscess as well as the left perineal abscess.  INDICATIONS:  The patient is a 34 year old female with severe obesity with diabetes, who has a history of abscesses, who presented to the emergency room late last night with complaints of discomfort starting about 6 days ago, that started small and  gradually progressed and severe pain.  She underwent a CT, which demonstrated a fairly large right posterior neck abscess.  There was also concern of another abscess in her left perineum or lower groin area.  On exam, I was definitely concerned about the  right posterior neck and it appeared that she had a superficial abscess of the left perineum.  We discussed risks and benefits of the proposed procedure in detail, which are separately documented.  DESCRIPTION OF PROCEDURE:  The patient was taken to OR #9 at [facility].  General endotracheal anesthesia was established.  She was then placed in the prone position with the appropriate padding.  Sequential compression devices had been placed.   Her posterior neck and upper back were then prepped and draped in the usual standard surgical fashion with Betadine.  She was on scheduled  therapeutic IV antibiotics.  A surgical timeout was performed.  She had a large area of induration and hard, firm  soft tissue abscess on the right posterior neck.  She had a large fat pad in her posterior neck area and a short neck.  Surgical timeout was performed.  I aspirated the area and got about at least 10 mL of pus.  This fluid was sent for culture.  I then  sharply made an elliptical incision with a 15 blade for approximately 4 cm long by about 2 cm wide.  She had extremely thick dermis.  I was able to get down the soft tissue and get into an abscess cavity.  It extended underneath the incision medially.   The abscess cavity was probed.  It was irrigated.  All the tissue was friable and oozy.  Hemostasis was ultimately achieved with electrocautery.  I did make a counterincision over the edge of the medial abscess cavity with a 15 blade.  I then brought out  a quarter-inch Penrose drain through the main abscess cavity through the counterincision and secured to itself with a 2-0 nylon suture.  I did not feel like I had any undrained areas.  The abscess cavity did go a little bit cephalad as well.  Because  the tissue was just raw and friable and oozy, I did place Arista hemostatic powder into the base of the wound, followed by 1-inch packing strip, followed by dry gauze and an ABD pad.  This was secured to the skin with tape.  We then had OR assistance  come into the room and she was  placed supine onto another OR table that had been brought into the room.  She was then placed in lithotomy position with the appropriate padding.  We then prepped and draped her perineum and proximal left groin and thigh  with Betadine.  Another timeout was performed.  She had what appeared to be hidradenitis of her perineum and inner groin area bilaterally.  The only area of active disease appeared to be the area where there was fluctuance in the left lower inguinal fold  near the perineum.  I aspirated this area and got  seropurulent fluid.  I then made a vertical incision of about 3.5 cm.  There was not hardly any pus, but just seropurulent material.  It was fairly oozy and again turned up the cautery to 80 and got as  much hemostasis as I could.  Arista powder was then placed into this cavity, followed by iodoform 1-inch gauze packing strip, followed by 4 x 4s, fluffs and ABD and mesh underwear.  It did not track.  All needle, instrument and sponge counts were correct  x2.  There were no immediate complications.  The patient tolerated the procedure well.  Please also see brief op note in medical record for addl wound measurements  VN/NUANCE  D:07/17/2020 T:07/17/2020 JOB:[id number]

## 2020-07-17 NOTE — Anesthesia Postprocedure Evaluation (Signed)
Anesthesia Post Note  Patient: Alexis Mathews  Procedure(s) Performed: IRRIGATION AND DEBRIDEMENT NECK ABSCESS (N/A Neck) IRRIGATION AND DEBRIDEMENT GROIN ABSCESS (N/A Groin)     Patient location during evaluation: PACU Anesthesia Type: General Level of consciousness: awake and alert Pain management: pain level controlled Vital Signs Assessment: post-procedure vital signs reviewed and stable Respiratory status: spontaneous breathing, nonlabored ventilation, respiratory function stable and patient connected to nasal cannula oxygen Cardiovascular status: blood pressure returned to baseline and stable Postop Assessment: no apparent nausea or vomiting Anesthetic complications: no   No complications documented.  Last Vitals:  Vitals:   07/17/20 1810 07/17/20 1838  BP: (!) 146/82 (!) 161/80  Pulse: 90 91  Resp: 20 18  Temp:  36.9 C  SpO2: 100% 95%    Last Pain:  Vitals:   07/17/20 1930  TempSrc:   PainSc: 2                  Treyton Slimp DAVID

## 2020-07-17 NOTE — ED Provider Notes (Signed)
[facility] EMERGENCY DEPARTMENT Provider Note   CSN: [id number] Arrival date & time: 07/16/20  1646     History Chief Complaint  Patient presents with   Neck Pain    Alexis Mathews is a 34 y.o. female with a hx of HTN, recurrent abscess presents to the Emergency Department complaining of gradual, persistent, progressively worsening abscess to the right neck and left buttocks onset several days ago.  Patient reports both have been worsening over the last few days.  She reports generally feeling unwell but denies measured fevers at home.  She is history of recurrent abscesses which have been I indeed but reports she is never had to undergo surgery or be admitted for either one.  No history of diabetes.  Unknown reason for patient's recurrent abscesses.  Patient reports warm compresses without improvement.  Movement and palpation make both abscesses worse.  Nothing seems to make them better.  The history is provided by the patient and medical records. No language interpreter was used.       Past Medical History:  Diagnosis Date   Abscess    Hypertension     There are no problems to display for this patient.   History reviewed. No pertinent surgical history.   OB History   No obstetric history on file.     History reviewed. No pertinent family history.  Social History   Tobacco Use   Smoking status: Never Smoker   Smokeless tobacco: Never Used  Vaping Use   Vaping Use: Never used  Substance Use Topics   Alcohol use: Yes   Drug use: Not on file    Home Medications Prior to Admission medications   Not on File    Allergies    Patient has no known allergies.  Review of Systems   Review of Systems  Constitutional: Positive for fever. Negative for appetite change, diaphoresis, fatigue and unexpected weight change.  HENT: Negative for mouth sores.   Eyes: Negative for visual disturbance.  Respiratory: Negative for cough, chest tightness, shortness  of breath and wheezing.   Cardiovascular: Negative for chest pain.  Gastrointestinal: Negative for abdominal pain, constipation, diarrhea, nausea and vomiting.  Endocrine: Negative for polydipsia, polyphagia and polyuria.  Genitourinary: Negative for dysuria, frequency, hematuria and urgency.  Musculoskeletal: Positive for neck pain. Negative for back pain and neck stiffness.  Skin: Positive for wound. Negative for rash.  Allergic/Immunologic: Negative for immunocompromised state.  Neurological: Negative for syncope, light-headedness and headaches.  Hematological: Does not bruise/bleed easily.  Psychiatric/Behavioral: Negative for sleep disturbance. The patient is not nervous/anxious.     Physical Exam Updated Vital Signs BP (!) 161/86   Pulse 90   Temp (!) 100.7 F (38.2 C) (Oral)   Resp 18   Ht 5\' 3"  (1.6 m)   Wt (!) 129.3 kg   LMP 07/02/2020 (Within Days)   SpO2 94%   BMI 50.49 kg/m   Physical Exam Vitals and nursing note reviewed.  Constitutional:      General: She is not in acute distress.    Appearance: She is not diaphoretic.  HENT:     Head: Normocephalic.  Eyes:     General: No scleral icterus.    Conjunctiva/sclera: Conjunctivae normal.  Neck:   Cardiovascular:     Rate and Rhythm: Normal rate and regular rhythm.     Pulses: Normal pulses.          Radial pulses are 2+ on the right side and 2+ on the  left side.  Pulmonary:     Effort: No tachypnea, accessory muscle usage, prolonged expiration, respiratory distress or retractions.     Breath sounds: No stridor.     Comments: Equal chest rise. No increased work of breathing. Abdominal:     General: There is no distension.     Palpations: Abdomen is soft.     Tenderness: There is no abdominal tenderness. There is no guarding or rebound.  Genitourinary:   Musculoskeletal:     Cervical back: Normal range of motion.     Comments: Moves all extremities equally and without difficulty.  Skin:    General:  Skin is warm and dry.     Capillary Refill: Capillary refill takes less than 2 seconds.  Neurological:     Mental Status: She is alert.     GCS: GCS eye subscore is 4. GCS verbal subscore is 5. GCS motor subscore is 6.     Comments: Speech is clear and goal oriented.  Psychiatric:        Mood and Affect: Mood normal.     ED Results / Procedures / Treatments   Labs (all labs ordered are listed, but only abnormal results are displayed) Labs Reviewed  COMPREHENSIVE METABOLIC PANEL - Abnormal; Notable for the following components:      Result Value   Glucose, Bld 163 (*)    Calcium 8.8 (*)    Total Protein 9.0 (*)    Albumin 3.1 (*)    AST 13 (*)    All other components within normal limits  CBC WITH DIFFERENTIAL/PLATELET - Abnormal; Notable for the following components:   WBC 17.5 (*)    Platelets 461 (*)    Neutro Abs 13.6 (*)    Monocytes Absolute 1.1 (*)    All other components within normal limits  CULTURE, BLOOD (SINGLE)  SARS CORONAVIRUS 2 BY RT PCR (HOSPITAL ORDER, PERFORMED IN [facility])  LACTIC ACID, PLASMA  LACTIC ACID, PLASMA  I-STAT BETA HCG BLOOD, ED ([facility], [facility], [facility] ONLY)     Radiology CT Soft Tissue Neck W Contrast  Result Date: 07/17/2020 CLINICAL DATA:  Initial evaluation for right-sided neck abscess. EXAM: CT NECK WITH CONTRAST TECHNIQUE: Multidetector CT imaging of the neck was performed using the standard protocol following the bolus administration of intravenous contrast. CONTRAST:  75mL OMNIPAQUE IOHEXOL 300 MG/ML  SOLN COMPARISON:  None. FINDINGS: Pharynx and larynx: Oral cavity within normal limits. Few scattered dental caries noted without acute inflammatory changes about the teeth. Palatine tonsils symmetric and within normal limits. Parapharyngeal fat maintained. Remainder of the oropharynx and nasopharynx within normal limits. No retropharyngeal collection. Normal epiglottis. Remainder of the hypopharynx and supraglottic larynx within normal  limits. True cords symmetric and normal. Subglottic airway clear. Salivary glands: Salivary glands including the parotid and submandibular glands are normal. Thyroid: Normal. Lymph nodes: Enlarged bilateral posterior chain/level 5 lymph nodes noted bilaterally, measuring up to 15 mm on the right and 14 mm on the left. Prominent supraclavicular nodes measure up to 10 mm. Findings presumably reactive. No other adenopathy within the neck. Vascular: Normal intravascular enhancement seen within the neck. Limited intracranial: Unremarkable. Visualized orbits: Unremarkable. Mastoids and visualized paranasal sinuses: Visualized paranasal sinuses are largely clear. Visualize mastoids and middle ear cavities are well pneumatized and free of fluid. Skeleton: No acute osseous finding. No discrete or worrisome osseous lesions. Upper chest: Visualized upper chest demonstrates no acute finding. Partially visualized lungs are grossly clear. Other: There is an irregular multiloculated  rim enhancing collection positioned within the central and right posterior neck, consistent with abscess. Collection measures 8.5 x 4.9 x 4.2 cm in greatest transaxial dimensions (transverse by AP by craniocaudad). Surrounding inflammatory stranding within the adjacent subcutaneous fat with overlying skin thickening, compatible with regional cellulitis. Stranding involves the underlying posterior paraspinous musculature. No extension into the deeper spaces of the neck or spinal canal at this time. IMPRESSION: 1. 8.5 x 4.9 x 4.2 cm multiloculated rim enhancing collection within the central and right posterior neck, consistent with abscess. Surrounding inflammatory stranding within the adjacent subcutaneous fat consistent with regional cellulitis. 2. Enlarged bilateral posterior chain/level 5 lymph nodes, presumably reactive. Electronically Signed   By: Rise Mu M.D.   On: 07/17/2020 02:43    Procedures .Critical Care Performed by:  Dierdre Forth, PA-C Authorized by: Dierdre Forth, PA-C   Critical care provider statement:    Critical care time (minutes):  45   Critical care time was exclusive of:  Separately billable procedures and treating other patients and teaching time   Critical care was necessary to treat or prevent imminent or life-threatening deterioration of the following conditions:  Sepsis   Critical care was time spent personally by me on the following activities:  Discussions with consultants, evaluation of patient's response to treatment, examination of patient, ordering and performing treatments and interventions, ordering and review of laboratory studies, ordering and review of radiographic studies, pulse oximetry, re-evaluation of patient's condition, obtaining history from patient or surrogate and review of old charts   I assumed direction of critical care for this patient from another provider in my specialty: no     (including critical care time)  Medications Ordered in ED Medications  lactated ringers infusion ( Intravenous New Bag/Given 07/17/20 0339)  lactated ringers bolus 1,000 mL (1,000 mLs Intravenous New Bag/Given 07/17/20 0339)    And  lactated ringers bolus 600 mL (has no administration in time range)  cefTRIAXone (ROCEPHIN) 2 g in sodium chloride 0.9 % 100 mL IVPB (2 g Intravenous New Bag/Given 07/17/20 0325)  vancomycin (VANCOREADY) IVPB 2000 mg/400 mL (has no administration in time range)  sodium chloride 0.9 % bolus 500 mL (500 mLs Intravenous New Bag/Given 07/17/20 0155)  morphine 4 MG/ML injection 4 mg (4 mg Intravenous Given 07/17/20 0155)  iohexol (OMNIPAQUE) 300 MG/ML solution 75 mL (75 mLs Intravenous Contrast Given 07/17/20 0214)  acetaminophen (TYLENOL) tablet 1,000 mg (1,000 mg Oral Given 07/17/20 0322)    ED Course  I have reviewed the triage vital signs and the nursing notes.  Pertinent labs & imaging results that were available during my care of the patient were reviewed  by me and considered in my medical decision making (see chart for details).  Clinical Course as of Jul 25 828  [facility] Jul 17, 2020  0301 febrile  Temp(!): 100.7 F (38.2 C) [HM]  0325 With leukocytosis.  Noted to be febrile around 2 AM.  Code sepsis initiated along with blood cultures.  Antibiotics ordered.  Acetaminophen given.  WBC(!): 17.5 [HM]  0325 1. 8.5 x 4.9 x 4.2 cm multiloculated rim enhancing collection within the central and right posterior neck, consistent with abscess.  Personally evaluated these images.  Abscess is too large and deep to be I indeed here in the emergency department.  CT Soft Tissue Neck W Contrast [HM]    Clinical Course User Index [HM] Keylah Darwish, Boyd Kerbs   MDM Rules/Calculators/A&P  Patient presents with 2 large abscesses.  Left buttock abscess with palpable fluctuance but draining spontaneously.  Right neck abscess is indurated.  Large fluid collection noted on CT scan.  This is too deep and multiloculated to be drained here in the emergency department.  Patient with leukocytosis but initially afebrile without tachycardia.  Patient did develop fever here in the emergency department.  Given leukocytosis, fever and source of infection code sepsis initiated along with antibiotics.  Will discuss with surgery for admission.  3:40 AM Discussed with Dr. Bedelia Person, general surgery, who will consult.  Request hospitalist management of sepsis and antibiotics.    3:55 AM Discussed patient's case with hospitalist.  I have recommended admission and patient (and family if present) agree with this plan. Admitting physician will place admission orders.    Final Clinical Impression(s) / ED Diagnoses Final diagnoses:  Neck abscess  Sepsis, due to unspecified organism, unspecified whether acute organ dysfunction present [facility])    Rx / DC Orders ED Discharge Orders     None        Pollina, Canary Brim, MD 07/26/20 [phone]

## 2020-07-17 NOTE — H&P (Signed)
TRH H&P    Patient Demographics:    Alexis Mathews, is a 34 y.o. female  MRN: [id number]  DOB - 01-13-1986  Admit Date - 07/16/2020  Referring MD/NP/PA: Dahlia Client  Outpatient Primary MD for the patient is Patient, No Pcp Per  Patient coming from: Home  Chief complaint- Abscess   HPI:    Alexis Mathews  is a 34 y.o. female, with history of HTN, Abscess, HS, and more presents to the ED with a c/c of Abscess. Patient reports that she first noticed the abscess developing 6 days ago. At that time it was small, non erythematous, and not painful. It continued to become gradually larger throughout the weak. Patient came in because the pain was too much for her to handle. Patient reports that she had tried tylenol at home, and it did help with the pain, but did not help with the instructions. At the pain's worst it was 10/10, after tylenol pain was 6/10. 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 Patient denies ever being told she has or is a carrier of MRSA. Patient reports that her last abscess and had an I and D here with our [facility] group.   Patient denies smoking, illicit drugs, and drinking.   No further complaints at this time.    Review of systems:    In addition to the HPI above,  No Fever-chills, No Headache, No changes with Vision or hearing, No problems swallowing food or Liquids, No Chest pain, Cough or Shortness of Breath, No Abdominal pain, No Nausea or Vomiting, bowel movements are regular, No Blood in stool or Urine, No dysuria,  No new joints pains-aches,  No new weakness, tingling, numbness in any extremity, No recent weight gain or loss, No polyuria, polydypsia or polyphagia, No significant Mental Stressors.  All other systems reviewed and are negative.    Past History of the following :    Past Medical History:  Diagnosis Date  .  Abscess   . Hypertension       History reviewed. No pertinent surgical history.    Social History:      Social History   Tobacco Use  . Smoking status: Never Smoker  . Smokeless tobacco: Never Used  Substance Use Topics  . Alcohol use: Yes       Family History :    History reviewed. No pertinent family history. No FM to review.    Home Medications:   Prior to Admission medications   Medication Sig Start Date End Date Taking? Authorizing Provider  acetaminophen (TYLENOL) 500 MG tablet Take 1,500 mg by mouth every 6 (six) hours as needed for mild pain or moderate pain.   Yes [provider]     Allergies:    No Known Allergies   Physical Exam:   Vitals  Blood pressure (!) 161/86, pulse 90, temperature 99.5 F (37.5 C), temperature source Oral, resp. rate 18, height 5\' 3"  (1.6 m), weight (!) 129.3 kg, last menstrual period 07/02/2020,  SpO2 94 %.  1.  General: Lying supine in bed in no acute distress  2. Psychiatric: Mood and behavior normal for situation  3. Neurologic: Cranial nerves II through XII are grossly intact, focal deficits are not noticed on exam,  4. HEENMT:  Head is atraumatic, normocephalic, pupils are reactive to light, mucous membranes are moist,  5. Respiratory : Lungs are clear to auscultation bilaterally  6. Cardiovascular : Heart rate and rhythm are regular no murmurs rubs or gallops  7. Gastrointestinal:  Abdomen is soft, nontender, nondistended   8. Skin:  Erythematous, large, firm abscess on right posterior neck. No overt drainage   9.Musculoskeletal:  No acute deformity    Data Review:    CBC Recent Labs  Lab 07/16/20 1817  WBC 17.5*  HGB 12.8  HCT 40.2  PLT 461*  MCV 88.5  MCH 28.2  MCHC 31.8  RDW 13.0  LYMPHSABS 2.6  MONOABS 1.1*  EOSABS 0.1  BASOSABS 0.1   ------------------------------------------------------------------------------------------------------------------  Results for orders  placed or performed during the hospital encounter of 07/16/20 (from the past 48 hour(s))  Lactic acid, plasma     Status: None   Collection Time: 07/16/20  6:17 PM  Result Value Ref Range   Lactic Acid, Venous 0.9 0.5 - 1.9 mmol/L    Comment: Performed at [facility], [address] [place], [place] [address]  Comprehensive metabolic panel     Status: Abnormal   Collection Time: 07/16/20  6:17 PM  Result Value Ref Range   Sodium 135 135 - 145 mmol/L   Potassium 4.0 3.5 - 5.1 mmol/L   Chloride 100 98 - 111 mmol/L   CO2 25 22 - 32 mmol/L   Glucose, Bld 163 (H) 70 - 99 mg/dL    Comment: Glucose reference range applies only to samples taken after fasting for at least 8 hours.   BUN 8 6 - 20 mg/dL   Creatinine, Ser 6.04 0.44 - 1.00 mg/dL   Calcium 8.8 (L) 8.9 - 10.3 mg/dL   Total Protein 9.0 (H) 6.5 - 8.1 g/dL   Albumin 3.1 (L) 3.5 - 5.0 g/dL   AST 13 (L) 15 - 41 U/L   ALT 15 0 - 44 U/L   Alkaline Phosphatase 56 38 - 126 U/L   Total Bilirubin 0.6 0.3 - 1.2 mg/dL   GFR calc non Af Amer >60 >60 mL/min   GFR calc Af Amer >60 >60 mL/min   Anion gap 10 5 - 15    Comment: Performed at [facility], [address] [place], [place] [address]  CBC with Differential     Status: Abnormal   Collection Time: 07/16/20  6:17 PM  Result Value Ref Range   WBC 17.5 (H) 4.0 - 10.5 K/uL   RBC 4.54 3.87 - 5.11 MIL/uL   Hemoglobin 12.8 12.0 - 15.0 g/dL   HCT 11.9 36 - 46 %   MCV 88.5 80.0 - 100.0 fL   MCH 28.2 26.0 - 34.0 pg   MCHC 31.8 30.0 - 36.0 g/dL   RDW 14.7 82.9 - 56.2 %   Platelets 461 (H) 150 - 400 K/uL   nRBC 0.0 0.0 - 0.2 %   Neutrophils Relative % 79 %   Neutro Abs 13.6 (H) 1.7 - 7.7 K/uL   Lymphocytes Relative 15 %   Lymphs Abs 2.6 0.7 - 4.0 K/uL   Monocytes Relative 6 %   Monocytes Absolute 1.1 (H) 0 - 1 K/uL  Eosinophils Relative 0 %   Eosinophils Absolute 0.1 0 - 0 K/uL   Basophils Relative 0 %   Basophils Absolute 0.1 0 - 0 K/uL   Immature Granulocytes 0 %    Abs Immature Granulocytes 0.07 0.00 - 0.07 K/uL    Comment: Performed at [facility], [address] [place], [place] [address]  I-Stat beta hCG blood, ED     Status: None   Collection Time: 07/16/20  6:45 PM  Result Value Ref Range   I-stat hCG, quantitative <5.0 <5 mIU/mL   Comment 3            Comment:   GEST. AGE      CONC.  (mIU/mL)   <=1 WEEK        5 - 50     2 WEEKS       50 - 500     3 WEEKS       100 - 10,000     4 WEEKS     1,000 - 30,000        FEMALE AND NON-PREGNANT FEMALE:     LESS THAN 5 mIU/mL   Lactic acid, plasma     Status: None   Collection Time: 07/17/20  2:00 AM  Result Value Ref Range   Lactic Acid, Venous 1.4 0.5 - 1.9 mmol/L    Comment: Performed at [facility], [address] [place], [place] [address]    Chemistries  Recent Labs  Lab 07/16/20 1817  NA 135  K 4.0  CL 100  CO2 25  GLUCOSE 163*  BUN 8  CREATININE 0.71  CALCIUM 8.8*  AST 13*  ALT 15  ALKPHOS 56  BILITOT 0.6   ------------------------------------------------------------------------------------------------------------------  ------------------------------------------------------------------------------------------------------------------ GFR: Estimated Creatinine Clearance: 128.9 mL/min (by C-G formula based on SCr of 0.71 mg/dL). Liver Function Tests: Recent Labs  Lab 07/16/20 1817  AST 13*  ALT 15  ALKPHOS 56  BILITOT 0.6  PROT 9.0*  ALBUMIN 3.1*   No results for input(s): LIPASE, AMYLASE in the last 168 hours. No results for input(s): AMMONIA in the last 168 hours. Coagulation Profile: No results for input(s): INR, PROTIME in the last 168 hours. Cardiac Enzymes: No results for input(s): CKTOTAL, CKMB, CKMBINDEX, TROPONINI in the last 168 hours. BNP (last 3 results) No results for input(s): PROBNP in the last 8760 hours. HbA1C: No results for input(s): HGBA1C in the last 72 hours. CBG: No results for input(s): GLUCAP in the last 168 hours. Lipid  Profile: No results for input(s): CHOL, HDL, LDLCALC, TRIG, CHOLHDL, LDLDIRECT in the last 72 hours. Thyroid Function Tests: No results for input(s): TSH, T4TOTAL, FREET4, T3FREE, THYROIDAB in the last 72 hours. Anemia Panel: No results for input(s): VITAMINB12, FOLATE, FERRITIN, TIBC, IRON, RETICCTPCT in the last 72 hours.  --------------------------------------------------------------------------------------------------------------- Urine analysis: No results found for: COLORURINE, APPEARANCEUR, LABSPEC, PHURINE, GLUCOSEU, HGBUR, BILIRUBINUR, KETONESUR, PROTEINUR, UROBILINOGEN, NITRITE, LEUKOCYTESUR    Imaging Results:    CT Soft Tissue Neck W Contrast  Result Date: 07/17/2020 CLINICAL DATA:  Initial evaluation for right-sided neck abscess. EXAM: CT NECK WITH CONTRAST TECHNIQUE: Multidetector CT imaging of the neck was performed using the standard protocol following the bolus administration of intravenous contrast. CONTRAST:  75mL OMNIPAQUE IOHEXOL 300 MG/ML  SOLN COMPARISON:  None. FINDINGS: Pharynx and larynx: Oral cavity within normal limits. Few scattered dental caries noted without acute inflammatory changes about the teeth. Palatine tonsils symmetric and within normal limits. Parapharyngeal fat maintained. Remainder of  the oropharynx and nasopharynx within normal limits. No retropharyngeal collection. Normal epiglottis. Remainder of the hypopharynx and supraglottic larynx within normal limits. True cords symmetric and normal. Subglottic airway clear. Salivary glands: Salivary glands including the parotid and submandibular glands are normal. Thyroid: Normal. Lymph nodes: Enlarged bilateral posterior chain/level 5 lymph nodes noted bilaterally, measuring up to 15 mm on the right and 14 mm on the left. Prominent supraclavicular nodes measure up to 10 mm. Findings presumably reactive. No other adenopathy within the neck. Vascular: Normal intravascular enhancement seen within the neck. Limited  intracranial: Unremarkable. Visualized orbits: Unremarkable. Mastoids and visualized paranasal sinuses: Visualized paranasal sinuses are largely clear. Visualize mastoids and middle ear cavities are well pneumatized and free of fluid. Skeleton: No acute osseous finding. No discrete or worrisome osseous lesions. Upper chest: Visualized upper chest demonstrates no acute finding. Partially visualized lungs are grossly clear. Other: There is an irregular multiloculated rim enhancing collection positioned within the central and right posterior neck, consistent with abscess. Collection measures 8.5 x 4.9 x 4.2 cm in greatest transaxial dimensions (transverse by AP by craniocaudad). Surrounding inflammatory stranding within the adjacent subcutaneous fat with overlying skin thickening, compatible with regional cellulitis. Stranding involves the underlying posterior paraspinous musculature. No extension into the deeper spaces of the neck or spinal canal at this time. IMPRESSION: 1. 8.5 x 4.9 x 4.2 cm multiloculated rim enhancing collection within the central and right posterior neck, consistent with abscess. Surrounding inflammatory stranding within the adjacent subcutaneous fat consistent with regional cellulitis. 2. Enlarged bilateral posterior chain/level 5 lymph nodes, presumably reactive. Electronically Signed   By: Rise Mu M.D.   On: 07/17/2020 02:43       Assessment & Plan:    Active Problems:   Cellulitis and abscess of neck   1. Sepsis 2/2 Cellulitis 1. HR 90, WBC 17.5 2. Sepsis bundle ordered in ED 3. Patient started on Rocephin and Vanc, and blood cultures collected 4. Lactic up trending from 0.9 to 1.4 - continue with trend for one more draw 5. Cellulitis focused orderset used - antibiotic regimen stepped back to Vancomycin monotherapy 2. Cellulitis and Abscess of Neck 1. Continue vanc 2. Consult GI for possible surgery 3. Leukocytosis 1. WBC 17.5 2/2 to above 2. Continue  treatments as above and repeat in the AM 4. Hyperglycemia 1. Glucose 163 2. No known history of DMII 3. Check hgb A1C  4. Patient NPO for possible surgical procedure - [employer] next time. 5.    DVT Prophylaxis-   Heparin - SCDs   AM Labs Ordered, also please review Full Orders  Family Communication: No family at bedside Code Status:  Full  Admission status:Inpatient :The appropriate admission status for this patient is INPATIENT. Inpatient status is judged to be reasonable and necessary in order to provide the required intensity of service to ensure the patient's safety. The patient's presenting symptoms, physical exam findings, and initial radiographic and laboratory data in the context of their chronic comorbidities is felt to place them at high risk for further clinical deterioration. Furthermore, it is not anticipated that the patient will be medically stable for discharge from the hospital within 2 midnights of admission. The following factors support the admission status of inpatient.     The patient's presenting symptoms include Swelling The worrisome physical exam findings include Cellulitis overlying abscess. The initial radiographic and laboratory data are worrisome because of WBC 17.5. The chronic co-morbidities include undiagnosed DMII.       * I certify  that at the point of admission it is my clinical judgment that the patient will require inpatient hospital care spanning beyond 2 midnights from the point of admission due to high intensity of service, high risk for further deterioration and high frequency of surveillance required.*  Time spent in minutes : 64   Orpah Hausner B Zierle-Ghosh DO

## 2020-07-17 NOTE — Progress Notes (Signed)
PROGRESS NOTE    Patient: Alexis Mathews                            PCP: Patient, No Pcp Per                    DOB: 05/26/1986            DOA: 07/16/2020 ZOX:[id number]             DOS: 07/17/2020, 11:20 AM   LOS: 0 days   Date of Service: The patient was seen and examined on 07/17/2020  Subjective:   The patient was seen and examined this Am. Hemodynamically stable Still complaining of: Neck discomfort, groin pain and draining Otherwise no issues overnight .  Brief Narrative:   Alexis Mathews  is a 34 y.o. female, with history of HTN, Abscess, HS, and more presents to the ED with a c/c of Abscess. Patient reports that she first noticed the abscess developing 6 days ago. At that time it was small, non erythematous, and not painful. It continued to become gradually larger throughout the weak. Patient came in because the pain was too much for her to handle. Patient reports that she had tried tylenol at home, and it did help with the pain, but did not help with the instructions. At the pain's worst it was 10/10, after tylenol pain was 6/10. 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 Patient denies ever being told she has or is a carrier of MRSA.  Patient reports a previous abscess and I&D in the past.... Approximately a year ago.    Assessment & Plan:   Active Problems:   Cellulitis and abscess of neck  Sepsis 2/2 Cellulitis -right neck abscess, left groin abscess -Patient met the sepsis criteria on admission with a leukocytosis of 17.5, heart rate as high as 106, T-max 100.7, lactic acid 0.9, 1.4, >>>> 1.1 now -Currently hemodynamically stable, temp 98, P 81, RR 16, BP 154/81, satting 90 -Per sepsis protocol IV antibiotics and IV fluid resuscitation was initiated in ED -We will follow the blood cultures -Anticipating intraoperative abscess fluid cultures -General surgery consulted -For cellulitis focus order antibiotics vancomycin  monotherapy was continued  -General surgery consulted, anticipating I&D today 07/17/20   Hyperglycemia Newly diagnosed diabetes mellitus II -Hemoglobin A1c 6.8,  -CBG 163, 126 -No known history of diabetes mellitus, -We will check CBG QA CHS, SSI coverage -Anticipating oral diabetic medication initiation for surgical intervention -Currently n.p.o. (will anticipate diabetic diet) -Diabetic education      Nutritional status:            Cultures; Blood Cultures x 2 >> NGT Pending intraoperative abscess culture >>  Antimicrobials: -IV Rocephin 8/1 x 1 -IV vancomycin 8/1 >>>   Consultants: General surgery   ------------------------------------------------------------------------------------------------------------------------------------------ DVT prophylaxis:  SCD/Compression stockings Code Status:   Code Status: Full Code Family Communication: No family member present at bedside The above findings and plan of care has been discussed with patient in detail,  they expressed understanding and agreement of above. -Advance care planning has been discussed.   Admission status:    Status is: Inpatient  Remains inpatient appropriate because:Inpatient level of care appropriate due to severity of illness   Dispo: The patient is from: Home              Anticipated d/c is to: Home  Anticipated d/c date is: 3 days              Patient currently is not medically stable to d/c.        Procedures:    Anticipated incision and drainage of posterior neck abscess, left groin abscess on 07/17/2020  Antimicrobials:  Anti-infectives (From admission, onward)   Start     Dose/Rate Route Frequency Ordered Stop   07/17/20 1400  vancomycin (VANCOCIN) IVPB 1000 mg/200 mL premix     Discontinue     1,000 mg 200 mL/hr over 60 Minutes Intravenous Every 8 hours 07/17/20 0401     07/17/20 0330  vancomycin (VANCOREADY) IVPB 2000 mg/400 mL        2,000 mg 200 mL/hr over  120 Minutes Intravenous  Once 07/17/20 0317 07/17/20 0705   07/17/20 0315  vancomycin (VANCOCIN) IVPB 1000 mg/200 mL premix  Status:  Discontinued        1,000 mg 200 mL/hr over 60 Minutes Intravenous  Once 07/17/20 0303 07/17/20 0305   07/17/20 0315  vancomycin (VANCOCIN) IVPB 1000 mg/200 mL premix  Status:  Discontinued        1,000 mg 200 mL/hr over 60 Minutes Intravenous  Once 07/17/20 0307 07/17/20 0317   07/17/20 0315  cefTRIAXone (ROCEPHIN) 2 g in sodium chloride 0.9 % 100 mL IVPB        2 g 200 mL/hr over 30 Minutes Intravenous  Once 07/17/20 0307 07/17/20 0409       Medication:  . heparin  5,000 Units Subcutaneous Q8H    acetaminophen **OR** acetaminophen, hydrALAZINE, morphine injection, ondansetron **OR** ondansetron (ZOFRAN) IV, oxyCODONE, polyethylene glycol   Objective:   Vitals:   07/17/20 0153 07/17/20 0300 07/17/20 0321 07/17/20 0813  BP: (!) 155/82 (!) 161/86  (!) 154/81  Pulse: 99 90  81  Resp: 18   16  Temp: (!) 100.7 F (38.2 C)  99.5 F (37.5 C) 98 F (36.7 C)  TempSrc: Oral  Oral Oral  SpO2: 98% 94%  99%  Weight:      Height:       No intake or output data in the 24 hours ending 07/17/20 1120 Filed Weights   07/16/20 1724  Weight: (!) 129.3 kg     Examination:   Physical Exam  Constitution:  Alert, cooperative, no distress,  Appears calm and comfortable  Psychiatric: Normal and stable mood and affect, cognition intact,   HEENT: Large posterior neck mass with mild tenderness, normocephalic, PERRL, otherwise with in Normal limits  Chest:Chest symmetric Cardio vascular:  S1/S2, RRR, No murmure, No Rubs or Gallops  pulmonary: Clear to auscultation bilaterally, respirations unlabored, negative wheezes / crackles Abdomen: Soft, non-tender, non-distended, bowel sounds,no masses, no organomegaly Muscular skeletal: Limited exam - in bed, able to move all 4 extremities, Normal strength,  Neuro: CNII-XII intact. , normal motor and sensation, reflexes  intact  Extremities: No pitting edema lower extremities, +2 pulses  Skin/ wounds: Dry, warm to touch, negative for any Rashes, posterior neck abscess, mass fluctuating,  Large left perineal/lower buttocks abscess-draining ------------------------------------------------------------------------------------------------------------------------------------------    LABs:  CBC Latest Ref Rng & Units 07/17/2020 07/16/2020  WBC 4.0 - 10.5 K/uL 16.0(H) 17.5(H)  Hemoglobin 12.0 - 15.0 g/dL 12.0 12.8  Hematocrit 36 - 46 % 37.9 40.2  Platelets 150 - 400 K/uL 429(H) 461(H)   CMP Latest Ref Rng & Units 07/17/2020 07/17/2020 07/16/2020  Glucose 70 - 99 mg/dL 126(H) - 163(H)  BUN 6 - 20 mg/dL 6 -  8  Creatinine 0.44 - 1.00 mg/dL 0.67 0.75 0.71  Sodium 135 - 145 mmol/L 134(L) - 135  Potassium 3.5 - 5.1 mmol/L 3.7 - 4.0  Chloride 98 - 111 mmol/L 99 - 100  CO2 22 - 32 mmol/L 26 - 25  Calcium 8.9 - 10.3 mg/dL 8.3(L) - 8.8(L)  Total Protein 6.5 - 8.1 g/dL 8.1 - 9.0(H)  Total Bilirubin 0.3 - 1.2 mg/dL 0.8 - 0.6  Alkaline Phos 38 - 126 U/L 49 - 56  AST 15 - 41 U/L 13(L) - 13(L)  ALT 0 - 44 U/L 12 - 15       Micro Results Recent Results (from the past 240 hour(s))  SARS Coronavirus 2 by RT PCR (hospital order, performed in [facility] hospital lab) Nasopharyngeal Nasopharyngeal Swab     Status: None   Collection Time: 07/17/20  3:40 AM   Specimen: Nasopharyngeal Swab  Result Value Ref Range Status   SARS Coronavirus 2 NEGATIVE NEGATIVE Final    Comment: (NOTE) SARS-CoV-2 target nucleic acids are NOT DETECTED.  The SARS-CoV-2 RNA is generally detectable in upper and lower respiratory specimens during the acute phase of infection. The lowest concentration of SARS-CoV-2 viral copies this assay can detect is 250 copies / mL. A negative result does not preclude SARS-CoV-2 infection and should not be used as the sole basis for treatment or other patient management decisions.  A negative result may occur  with improper specimen collection / handling, submission of specimen other than nasopharyngeal swab, presence of viral mutation(s) within the areas targeted by this assay, and inadequate number of viral copies (<250 copies / mL). A negative result must be combined with clinical observations, patient history, and epidemiological information.  Fact Sheet for Patients:   StrictlyIdeas.no  Fact Sheet for Healthcare Providers: BankingDealers.co.za  This test is not yet approved or  cleared by the [place] FDA and has been authorized for detection and/or diagnosis of SARS-CoV-2 by FDA under an Emergency Use Authorization (EUA).  This EUA will remain in effect (meaning this test can be used) for the duration of the COVID-19 declaration under Section 564(b)(1) of the Act, 21 U.S.C. section 360bbb-3(b)(1), unless the authorization is terminated or revoked sooner.  Performed at [facility], [place] [address], [place], [place] [address]     Radiology Reports CT Soft Tissue Neck W Contrast  Result Date: 07/17/2020 CLINICAL DATA:  Initial evaluation for right-sided neck abscess. EXAM: CT NECK WITH CONTRAST TECHNIQUE: Multidetector CT imaging of the neck was performed using the standard protocol following the bolus administration of intravenous contrast. CONTRAST:  99m OMNIPAQUE IOHEXOL 300 MG/ML  SOLN COMPARISON:  None. FINDINGS: Pharynx and larynx: Oral cavity within normal limits. Few scattered dental caries noted without acute inflammatory changes about the teeth. Palatine tonsils symmetric and within normal limits. Parapharyngeal fat maintained. Remainder of the oropharynx and nasopharynx within normal limits. No retropharyngeal collection. Normal epiglottis. Remainder of the hypopharynx and supraglottic larynx within normal limits. True cords symmetric and normal. Subglottic airway clear. Salivary glands: Salivary glands including the  parotid and submandibular glands are normal. Thyroid: Normal. Lymph nodes: Enlarged bilateral posterior chain/level 5 lymph nodes noted bilaterally, measuring up to 15 mm on the right and 14 mm on the left. Prominent supraclavicular nodes measure up to 10 mm. Findings presumably reactive. No other adenopathy within the neck. Vascular: Normal intravascular enhancement seen within the neck. Limited intracranial: Unremarkable. Visualized orbits: Unremarkable. Mastoids and visualized paranasal sinuses: Visualized paranasal sinuses are  largely clear. Visualize mastoids and middle ear cavities are well pneumatized and free of fluid. Skeleton: No acute osseous finding. No discrete or worrisome osseous lesions. Upper chest: Visualized upper chest demonstrates no acute finding. Partially visualized lungs are grossly clear. Other: There is an irregular multiloculated rim enhancing collection positioned within the central and right posterior neck, consistent with abscess. Collection measures 8.5 x 4.9 x 4.2 cm in greatest transaxial dimensions (transverse by AP by craniocaudad). Surrounding inflammatory stranding within the adjacent subcutaneous fat with overlying skin thickening, compatible with regional cellulitis. Stranding involves the underlying posterior paraspinous musculature. No extension into the deeper spaces of the neck or spinal canal at this time. IMPRESSION: 1. 8.5 x 4.9 x 4.2 cm multiloculated rim enhancing collection within the central and right posterior neck, consistent with abscess. Surrounding inflammatory stranding within the adjacent subcutaneous fat consistent with regional cellulitis. 2. Enlarged bilateral posterior chain/level 5 lymph nodes, presumably reactive. Electronically Signed   By: Jeannine Boga M.D.   On: 07/17/2020 02:43    SIGNED: Deatra James, MD, FACP, FHM. [facility],  Pager (please use [email or web address] to page/text)  If 7PM-7AM, please contact  night-coverage [email or web address], Password [facility] 07/17/2020, 11:20 AM

## 2020-07-18 ENCOUNTER — Encounter (HOSPITAL_COMMUNITY): Payer: Self-pay | Admitting: General Surgery

## 2020-07-18 DIAGNOSIS — A419 Sepsis, unspecified organism: Principal | ICD-10-CM

## 2020-07-18 DIAGNOSIS — E1165 Type 2 diabetes mellitus with hyperglycemia: Secondary | ICD-10-CM

## 2020-07-18 DIAGNOSIS — R739 Hyperglycemia, unspecified: Secondary | ICD-10-CM

## 2020-07-18 LAB — CBC
HCT: 38 % (ref 36.0–46.0)
Hemoglobin: 12.2 g/dL (ref 12.0–15.0)
MCH: 28.2 pg (ref 26.0–34.0)
MCHC: 32.1 g/dL (ref 30.0–36.0)
MCV: 87.8 fL (ref 80.0–100.0)
Platelets: 459 10*3/uL — ABNORMAL HIGH (ref 150–400)
RBC: 4.33 MIL/uL (ref 3.87–5.11)
RDW: 12.9 % (ref 11.5–15.5)
WBC: 15.9 10*3/uL — ABNORMAL HIGH (ref 4.0–10.5)
nRBC: 0 % (ref 0.0–0.2)

## 2020-07-18 LAB — BASIC METABOLIC PANEL
Anion gap: 9 (ref 5–15)
BUN: 5 mg/dL — ABNORMAL LOW (ref 6–20)
CO2: 27 mmol/L (ref 22–32)
Calcium: 8.9 mg/dL (ref 8.9–10.3)
Chloride: 98 mmol/L (ref 98–111)
Creatinine, Ser: 0.72 mg/dL (ref 0.44–1.00)
GFR calc Af Amer: >60 mL/min (ref >60)
GFR calc non Af Amer: >60 mL/min (ref >60)
Glucose, Bld: 229 mg/dL — ABNORMAL HIGH (ref 70–99)
Potassium: 4.8 mmol/L (ref 3.5–5.1)
Sodium: 134 mmol/L — ABNORMAL LOW (ref 135–145)

## 2020-07-18 LAB — GLUCOSE, CAPILLARY
Glucose-Capillary: 176 mg/dL — ABNORMAL HIGH (ref 70–99)
Glucose-Capillary: 176 mg/dL — ABNORMAL HIGH (ref 70–99)
Glucose-Capillary: 229 mg/dL — ABNORMAL HIGH (ref 70–99)
Glucose-Capillary: 177 mg/dL — ABNORMAL HIGH (ref 70–99)
Glucose-Capillary: 117 mg/dL — ABNORMAL HIGH (ref 70–99)

## 2020-07-18 MED ORDER — LIVING WELL WITH DIABETES BOOK
Freq: Once | Status: AC
  Filled 2020-07-18: qty 1

## 2020-07-18 NOTE — Progress Notes (Signed)
Inpatient Diabetes Program Recommendations  AACE/ADA: New Consensus Statement on Inpatient Glycemic Control (2015)  Target Ranges:  Prepandial:   less than 140 mg/dL      Peak postprandial:   less than 180 mg/dL (1-2 hours)      Critically ill patients:  140 - 180 mg/dL   Results for Alexis Mathews, Alexis Mathews (MRN [id number]) as of 07/18/2020 13:36  Ref. Range 07/18/2020 07:17 07/18/2020 11:58  Glucose-Capillary Latest Ref Range: 70 - 99 mg/dL 176 (H)  Refused Novolog 229 (H)  Refused Novolog   Results for Alexis Mathews, Alexis Mathews (MRN [id number]) as of 07/18/2020 13:36  Ref. Range 07/17/2020 07:14  Hemoglobin A1C Latest Ref Range: 4.8 - 5.6 % 6.8 (H)    Cellulitis and Abscess of Neck/ Groin  New Diagnosis of Diabetes this Admission per MD notes  Current Insulin Orders: Novolog Sensitive Correction Scale/ SSI (0-9 units) TID AC        Metformin 500 mg Daily    Met w/ pt at bedside again today.  Pt told me Dr. Jamesetta Geralds discussed her glucose levels with her and she knows she will need to take medicine and check her CBGs at home.  Spoke with pt about new diagnosis.  Discussed A1C results with her again (also spoke w/ pt yesterday) and explained what an A1C is, basic pathophysiology of DM Type 2, basic home care, basic diabetes diet nutrition principles, importance of checking CBGs and maintaining good CBG control to prevent long-term and short-term complications.  Also reviewed blood sugar goals and A1c goals for home.    Have ordered educational booklet and RD consult for DM diet education for this patient.  Strongly encouraged pt to seek care under a PCP after discharge.  Asked pt to check her CBGs at least once daily at home and to check at different times to get a variety of readings (either before meals or 2 hours after meals).  If MD does not give pt a Rx for CBG meter at time of d/c, asked pt to go to local pharmacy and purchase a CBG meter OTC ([facility] has most affordable CBG meter OTC).  Pt agreeable.   Pt did not have nay extra questions for me at this time.     --Will follow patient during hospitalization--  Wyn Quaker RN, MSN, CDE Diabetes Coordinator Inpatient Glycemic Control Team Team Pager: [phone] (8a-5p)

## 2020-07-18 NOTE — Plan of Care (Signed)
RD working remotely.   RD consulted for nutrition education regarding diabetes.   Lab Results  Component Value Date   HGBA1C 6.8 (H) 07/17/2020   Spoke with pt over the phone, who was pleasant and in good spirits today. She reports that she learned about her new DM diagnosis during the admission and while she feels a little scared, she is trying to stay positive. She reports that she works second shift, but has been waiting a transfer to first shift at her job; she believes that this will change her lifestyle and eating habits immensely as she typically wakes up early. She shares her current work hours often interfere with her routine and does not eat on a schedule like she should. Pt reports she usually consumes 1-2 meals per day. Beverages consist of 1-2 cups of cranberry juice, occasional soda, and water.  Pt able to teach back to this RD principles of DM diet and self-management that were reviewed in DM coordinator's earlier note. RD educated pt on a general, healthful diet and lifestyle as well as importance of DM self-management to prevent further complications. Pt expressed interest in finding a PCP and hopes to pursue bariatric surgery in the future, as she has a family member who had a good experience with this. RD explained to pt that bariatric surgery can be a great option for weight loss, but management of any chronic disease requires lifelong lifestyle modifications and regular monitoring to be successful. Encouraged pt to continue to pursue options to improve her health and receive information for her to make an informed decision that works best for her. RD provided information in discharge summary regarding in-person and online seminars for [facility] Bariat[facility] so pt will have a better understand of what this process entails. RD also referred pt to [facility]'s Nutrition and Diabetes Education Services for further reinforcement.   RD provided "Carbohydrate Counting for People  with Diabetes" handout from the Academy of Nutrition and Dietetics. Attached to AVS/ discharge instructions. Discussed different food groups and their effects on blood sugar, emphasizing carbohydrate-containing foods. Provided list of carbohydrates and recommended serving sizes of common foods.  Discussed importance of controlled and consistent carbohydrate intake throughout the day. Provided examples of ways to balance meals/snacks and encouraged intake of high-fiber, whole grain complex carbohydrates. Teach back method used.  Expect  fair to good compliance.  Body mass index is 50.49 kg/m. Pt meets criteria for extreme obesity, class III based on current BMI.  Current diet order is carb modified, patient is consuming approximately 100% of meals at this time. Labs and medications reviewed. No further nutrition interventions warranted at this time. RD contact information provided. If additional nutrition issues arise, please re-consult RD.  Alexis Mathews, RD, LDN, CDCES Registered Dietitian II Certified Diabetes Care and Education Specialist Please refer to [facility] for RD and/or RD on-call/weekend/after hours pager

## 2020-07-18 NOTE — Plan of Care (Signed)
  Problem: Education: Goal: Knowledge of General Education information will improve Description Including pain rating scale, medication(s)/side effects and non-pharmacologic comfort measures Outcome: Progressing   

## 2020-07-18 NOTE — Discharge Instructions (Addendum)
Wound Care, Adult - For your buttock wound. Please cover with dry gauze and tape. Change daily or as needed for soiling - For your neck wound, please keep penrose drain in place. Please wick the wound open with a small piece of gauze. Please cover with dry gauze and tape. Change daily or as needed for soiling - You can shower when you get home. Please let soapy water run over the wounds, pat dry and apply dressing as noted above.  Taking care of your wound properly can help to prevent pain, infection, and scarring. It can also help your wound to heal more quickly. How to care for your wound Wound care      Follow instructions from your health care provider about how to take care of your wound. Make sure you: ? Wash your hands with soap and water before you change the bandage (dressing). If soap and water are not available, use hand sanitizer. ? Change your dressing as told by your health care provider. ? Leave stitches (sutures), skin glue, or adhesive strips in place. These skin closures may need to stay in place for 2 weeks or longer. If adhesive strip edges start to loosen and curl up, you may trim the loose edges. Do not remove adhesive strips completely unless your health care provider tells you to do that.  Check your wound area every day for signs of infection. Check for: ? Redness, swelling, or pain. ? Fluid or blood. ? Warmth. ? Pus or a bad smell.  Ask your health care provider if you should clean the wound with mild soap and water. Doing this may include: ? Using a clean towel to pat the wound dry after cleaning it. Do not rub or scrub the wound. ? Applying a cream or ointment. Do this only as told by your health care provider. ? Covering the incision with a clean dressing.  Ask your health care provider when you can leave the wound uncovered.  Keep the dressing dry until your health care provider says it can be removed. Do not take baths, swim, use a hot tub, or do anything  that would put the wound underwater until your health care provider approves. Ask your health care provider if you can take showers. You may only be allowed to take sponge baths. Medicines   If you were prescribed an antibiotic medicine, cream, or ointment, take or use the antibiotic as told by your health care provider. Do not stop taking or using the antibiotic even if your condition improves.  Take over-the-counter and prescription medicines only as told by your health care provider. If you were prescribed pain medicine, take it 30 or more minutes before you do any wound care or as told by your health care provider. General instructions  Return to your normal activities as told by your health care provider. Ask your health care provider what activities are safe.  Do not scratch or pick at the wound.  Do not use any products that contain nicotine or tobacco, such as cigarettes and e-cigarettes. These may delay wound healing. If you need help quitting, ask your health care provider.  Keep all follow-up visits as told by your health care provider. This is important.  Eat a diet that includes protein, vitamin A, vitamin C, and other nutrient-rich foods to help the wound heal. ? Foods rich in protein include meat, dairy, beans, nuts, and other sources. ? Foods rich in vitamin A include carrots and dark green, leafy vegetables. ?  Foods rich in vitamin C include citrus, tomatoes, and other fruits and vegetables. ? Nutrient-rich foods have protein, carbohydrates, fat, vitamins, or minerals. Eat a variety of healthy foods including vegetables, fruits, and whole grains. Contact a health care provider if:  You received a tetanus shot and you have swelling, severe pain, redness, or bleeding at the injection site.  Your pain is not controlled with medicine.  You have redness, swelling, or pain around the wound.  You have fluid or blood coming from the wound.  Your wound feels warm to the  touch.  You have pus or a bad smell coming from the wound.  You have a fever or chills.  You are nauseous or you vomit.  You are dizzy. Get help right away if:  You have a red streak going away from your wound.  The edges of the wound open up and separate.  Your wound is bleeding, and the bleeding does not stop with gentle pressure.  You have a rash.  You faint.  You have trouble breathing. Summary  Always wash your hands with soap and water before changing your bandage (dressing).  To help with healing, eat foods that are rich in protein, vitamin A, vitamin C, and other nutrients.  Check your wound every day for signs of infection. Contact your health care provider if you suspect that your wound is infected. This information is not intended to replace advice given to you by your health care provider. Make sure you discuss any questions you have with your health care provider. Document Revised: 03/22/2019 Document Reviewed: 06/18/2016 Elsevier Patient Education  2020 [employer]   Fingerstick glucose (sugar) goals for home: Before meals: 80-130 mg/dl 2-Hours after meals: less than 180 mg/dl Hemoglobin Z6X goal: 7% or less  Please check you sugar at least once a day at home either before meals or 2 hours after meals.    Carbohydrate Counting For People With Diabetes  Foods with carbohydrates make your blood glucose level go up. Learning how to count carbohydrates can help you control your blood glucose levels. First, identify the foods you eat that contain carbohydrates. Then, using the Foods with Carbohydrates chart, determine about how much carbohydrates are in your meals and snacks. Make sure you are eating foods with fiber, protein, and healthy fat along with your carbohydrate foods. Foods with Carbohydrates The following table shows carbohydrate foods that have about 15 grams of carbohydrate each. Using measuring cups, spoons, or a food scale when you first begin  learning about carbohydrate counting can help you learn about the portion sizes you typically eat. The following foods have 15 grams carbohydrate each:  Grains . 1 slice bread (1 ounce)  . 1 small tortilla (6-inch size)  .  large bagel (1 ounce)  . 1/3 cup pasta or rice (cooked)  .  hamburger or hot dog bun ( ounce)  .  cup cooked cereal  .  to  cup ready-to-eat cereal  . 2 taco shells (5-inch size) Fruit . 1 small fresh fruit ( to 1 cup)  .  medium banana  . 17 small grapes (3 ounces)  . 1 cup melon or berries  .  cup canned or frozen fruit  . 2 tablespoons dried fruit (blueberries, cherries, cranberries, raisins)  .  cup unsweetened fruit juice  Starchy Vegetables .  cup cooked beans, peas, corn, potatoes/sweet potatoes  .  large baked potato (3 ounces)  . 1 cup acorn or butternut squash  Snack Foods .  3 to 6 crackers  . 8 potato chips or 13 tortilla chips ( ounce to 1 ounce)  . 3 cups popped popcorn  Dairy . 3/4 cup (6 ounces) nonfat plain yogurt, or yogurt with sugar-free sweetener  . 1 cup milk  . 1 cup plain rice, soy, coconut or flavored almond milk Sweets and Desserts .  cup ice cream or frozen yogurt  . 1 tablespoon jam, jelly, pancake syrup, table sugar, or honey  . 2 tablespoons light pancake syrup  . 1 inch square of frosted cake or 2 inch square of unfrosted cake  . 2 small cookies (2/3 ounce each) or  large cookie  Sometimes you'll have to estimate carbohydrate amounts if you don't know the exact recipe. One cup of mixed foods like soups can have 1 to 2 carbohydrate servings, while some casseroles might have 2 or more servings of carbohydrate. Foods that have less than 20 calories in each serving can be counted as "free" foods. Count 1 cup raw vegetables, or  cup cooked non-starchy vegetables as "free" foods. If you eat 3 or more servings at one meal, then count them as 1 carbohydrate serving.  Foods without Carbohydrates  Not all foods contain  carbohydrates. Meat, some dairy, fats, non-starchy vegetables, and many beverages don't contain carbohydrate. So when you count carbohydrates, you can generally exclude chicken, pork, beef, fish, seafood, eggs, tofu, cheese, butter, sour cream, avocado, nuts, seeds, olives, mayonnaise, water, black coffee, unsweetened tea, and zero-calorie drinks. Vegetables with no or low carbohydrate include green beans, cauliflower, tomatoes, and onions. How much carbohydrate should I eat at each meal?  Carbohydrate counting can help you plan your meals and manage your weight. Following are some starting points for carbohydrate intake at each meal. Work with your registered dietitian nutritionist to find the best range that works for your blood glucose and weight.   To Lose Weight To Maintain Weight  Women 2 - 3 carb servings 3 - 4 carb servings  Men 3 - 4 carb servings 4 - 5 carb servings  Checking your blood glucose after meals will help you know if you need to adjust the timing, type, or number of carbohydrate servings in your meal plan. Achieve and keep a healthy body weight by balancing your food intake and physical activity.  Tips How should I plan my meals?  Plan for half the food on your plate to include non-starchy vegetables, like salad greens, broccoli, or carrots. Try to eat 3 to 5 servings of non-starchy vegetables every day. Have a protein food at each meal. Protein foods include chicken, fish, meat, eggs, or beans (note that beans contain carbohydrate). These two food groups (non-starchy vegetables and proteins) are low in carbohydrate. If you fill up your plate with these foods, you will eat less carbohydrate but still fill up your stomach. Try to limit your carbohydrate portion to  of the plate.  What fats are healthiest to eat?  Diabetes increases risk for heart disease. To help protect your heart, eat more healthy fats, such as olive oil, nuts, and avocado. Eat less saturated fats like butter,  cream, and high-fat meats, like bacon and sausage. Avoid trans fats, which are in all foods that list "partially hydrogenated oil" as an ingredient. What should I drink?  Choose drinks that are not sweetened with sugar. The healthiest choices are water, carbonated or seltzer waters, and tea and coffee without added sugars.  Sweet drinks will make your blood glucose go up very  quickly. One serving of soda or energy drink is  cup. It is best to drink these beverages only if your blood glucose is low.  Artificially sweetened, or diet drinks, typically do not increase your blood glucose if they have zero calories in them. Read labels of beverages, as some diet drinks do have carbohydrate and will raise your blood glucose. Label Reading Tips Read Nutrition Facts labels to find out how many grams of carbohydrate are in a food you want to eat. Don't forget: sometimes serving sizes on the label aren't the same as how much food you are going to eat, so you may need to calculate how much carbohydrate is in the food you are serving yourself.   Carbohydrate Counting for People with Diabetes Sample 1-Day Menu  Breakfast  cup yogurt, low fat, low sugar (1 carbohydrate serving)   cup cereal, ready-to-eat, unsweetened (1 carbohydrate serving)  1 cup strawberries (1 carbohydrate serving)   cup almonds ( carbohydrate serving)  Lunch 1, 5 ounce can chunk light tuna  2 ounces cheese, low fat cheddar  6 whole wheat crackers (1 carbohydrate serving)  1 small apple (1 carbohydrate servings)   cup carrots ( carbohydrate serving)   cup snap peas  1 cup 1% milk (1 carbohydrate serving)   Evening Meal Stir fry made with: 3 ounces chicken  1 cup brown rice (3 carbohydrate servings)   cup broccoli ( carbohydrate serving)   cup green beans   cup onions  1 tablespoon olive oil  2 tablespoons teriyaki sauce ( carbohydrate serving)  Evening Snack 1 extra small banana (1 carbohydrate serving)  1 tablespoon  peanut butter   Carbohydrate Counting for People with Diabetes Vegan Sample 1-Day Menu  Breakfast 1 cup cooked oatmeal (2 carbohydrate servings)   cup blueberries (1 carbohydrate serving)  2 tablespoons flaxseeds  1 cup soymilk fortified with calcium and vitamin D  1 cup coffee  Lunch 2 slices whole wheat bread (2 carbohydrate servings)   cup baked tofu   cup lettuce  2 slices tomato  2 slices avocado   cup baby carrots ( carbohydrate serving)  1 orange (1 carbohydrate serving)  1 cup soymilk fortified with calcium and vitamin D   Evening Meal Burrito made with: 1 6-inch corn tortilla (1 carbohydrate serving)  1 cup refried vegetarian beans (2 carbohydrate servings)   cup chopped tomatoes   cup lettuce   cup salsa  1/3 cup brown rice (1 carbohydrate serving)  1 tablespoon olive oil for rice   cup zucchini   Evening Snack 6 small whole grain crackers (1 carbohydrate serving)  2 apricots ( carbohydrate serving)   cup unsalted peanuts ( carbohydrate serving)    Carbohydrate Counting for People with Diabetes Vegetarian (Lacto-Ovo) Sample 1-Day Menu  Breakfast 1 cup cooked oatmeal (2 carbohydrate servings)   cup blueberries (1 carbohydrate serving)  2 tablespoons flaxseeds  1 egg  1 cup 1% milk (1 carbohydrate serving)  1 cup coffee  Lunch 2 slices whole wheat bread (2 carbohydrate servings)  2 ounces low-fat cheese   cup lettuce  2 slices tomato  2 slices avocado   cup baby carrots ( carbohydrate serving)  1 orange (1 carbohydrate serving)  1 cup unsweetened tea  Evening Meal Burrito made with: 1 6-inch corn tortilla (1 carbohydrate serving)   cup refried vegetarian beans (1 carbohydrate serving)   cup tomatoes   cup lettuce   cup salsa  1/3 cup brown rice (1 carbohydrate serving)  1  tablespoon olive oil for rice   cup zucchini  1 cup 1% milk (1 carbohydrate serving)  Evening Snack 6 small whole grain crackers (1 carbohydrate serving)  2 apricots  ( carbohydrate serving)   cup unsalted peanuts ( carbohydrate serving)    Copyright 2020  Academy of [employer] and Dietetics. All rights reserved.  Using Nutrition Labels: Carbohydrate  . Serving Size  . Look at the serving size. All the information on the label is based on this portion. Jolyne Loa Per Container  . The number of servings contained in the package. . Guidelines for Carbohydrate  . Look at the total grams of carbohydrate in the serving size.  . 1 carbohydrate choice = 15 grams of carbohydrate. Range of Carbohydrate Grams Per Choice  Carbohydrate Grams/Choice Carbohydrate Choices  6-10   11-20 1  21-25 1  26-35 2  36-40 2  41-50 3  51-55 3  56-65 4  66-70 4  71-80 5    Copyright 2020  Academy of [employer] and Dietetics. All rights reserved.  [facility]: [email or web address]  Online Webinar: [email or web address]

## 2020-07-18 NOTE — Progress Notes (Signed)
1 Day Post-Op  Subjective: CC: Doing well. Pain well controlled. Patient reports that she at home alone. She has a sister that lives nearby that may be able to help with dressing changes.  Objective: Vital signs in last 24 hours: Temp:  [97 F (36.1 C)-100 F (37.8 C)] 100 F (37.8 C) (08/03 0827) Pulse Rate:  [56-108] 56 (08/03 0827) Resp:  [15-20] 18 (08/03 0827) BP: (140-171)/(75-95) 143/75 (08/03 0827) SpO2:  [95 %-100 %] 97 % (08/03 0827) Last BM Date: 07/16/20  Intake/Output from previous day: 08/02 0701 - 08/03 0700 In: 1763.3 [P.O.:420; I.V.:787.5; IV Piggyback:555.8] Out: -  Intake/Output this shift: No intake/output data recorded.  PE: Wounds Neck: right posterior neck packing removed. Wound edges are healthy without erythema, induration. Base of the wound with granulation and cauterized. Some thin bloody drainage. Penrose drain is in place. Repacked. No further areas of fluctuance.  Left buttock/perineum: Packing removed.  Wound edges are healthy without erythema, induration. Base of the wound with granulation and cauterized. No drainage. Repacked. No further areas of fluctuance.    Lab Results:  Recent Labs    07/17/20 0715 07/18/20 0048  WBC 16.0* 15.9*  HGB 12.0 12.2  HCT 37.9 38.0  PLT 429* 459*   BMET Recent Labs    07/17/20 0714 07/18/20 0048  NA 134* 134*  K 3.7 4.8  CL 99 98  CO2 26 27  GLUCOSE 126* 229*  BUN 6 5*  CREATININE 0.67  0.75 0.72  CALCIUM 8.3* 8.9   PT/INR No results for input(s): LABPROT, INR in the last 72 hours. CMP     Component Value Date/Time   NA 134 (L) 07/18/2020 0048   K 4.8 07/18/2020 0048   CL 98 07/18/2020 0048   CO2 27 07/18/2020 0048   GLUCOSE 229 (H) 07/18/2020 0048   BUN 5 (L) 07/18/2020 0048   CREATININE 0.72 07/18/2020 0048   CALCIUM 8.9 07/18/2020 0048   PROT 8.1 07/17/2020 0714   ALBUMIN 2.6 (L) 07/17/2020 0714   AST 13 (L) 07/17/2020 0714   ALT 12 07/17/2020 0714   ALKPHOS 49 07/17/2020  0714   BILITOT 0.8 07/17/2020 0714   GFRNONAA >60 07/18/2020 0048   GFRAA >60 07/18/2020 0048   Lipase  No results found for: LIPASE     Studies/Results: CT Soft Tissue Neck W Contrast  Result Date: 07/17/2020 CLINICAL DATA:  Initial evaluation for right-sided neck abscess. EXAM: CT NECK WITH CONTRAST TECHNIQUE: Multidetector CT imaging of the neck was performed using the standard protocol following the bolus administration of intravenous contrast. CONTRAST:  73mL OMNIPAQUE IOHEXOL 300 MG/ML  SOLN COMPARISON:  None. FINDINGS: Pharynx and larynx: Oral cavity within normal limits. Few scattered dental caries noted without acute inflammatory changes about the teeth. Palatine tonsils symmetric and within normal limits. Parapharyngeal fat maintained. Remainder of the oropharynx and nasopharynx within normal limits. No retropharyngeal collection. Normal epiglottis. Remainder of the hypopharynx and supraglottic larynx within normal limits. True cords symmetric and normal. Subglottic airway clear. Salivary glands: Salivary glands including the parotid and submandibular glands are normal. Thyroid: Normal. Lymph nodes: Enlarged bilateral posterior chain/level 5 lymph nodes noted bilaterally, measuring up to 15 mm on the right and 14 mm on the left. Prominent supraclavicular nodes measure up to 10 mm. Findings presumably reactive. No other adenopathy within the neck. Vascular: Normal intravascular enhancement seen within the neck. Limited intracranial: Unremarkable. Visualized orbits: Unremarkable. Mastoids and visualized paranasal sinuses: Visualized paranasal sinuses are largely clear. Visualize  mastoids and middle ear cavities are well pneumatized and free of fluid. Skeleton: No acute osseous finding. No discrete or worrisome osseous lesions. Upper chest: Visualized upper chest demonstrates no acute finding. Partially visualized lungs are grossly clear. Other: There is an irregular multiloculated rim enhancing  collection positioned within the central and right posterior neck, consistent with abscess. Collection measures 8.5 x 4.9 x 4.2 cm in greatest transaxial dimensions (transverse by AP by craniocaudad). Surrounding inflammatory stranding within the adjacent subcutaneous fat with overlying skin thickening, compatible with regional cellulitis. Stranding involves the underlying posterior paraspinous musculature. No extension into the deeper spaces of the neck or spinal canal at this time. IMPRESSION: 1. 8.5 x 4.9 x 4.2 cm multiloculated rim enhancing collection within the central and right posterior neck, consistent with abscess. Surrounding inflammatory stranding within the adjacent subcutaneous fat consistent with regional cellulitis. 2. Enlarged bilateral posterior chain/level 5 lymph nodes, presumably reactive. Electronically Signed   By: Rise Mu M.D.   On: 07/17/2020 02:43    Anti-infectives: Anti-infectives (From admission, onward)   Start     Dose/Rate Route Frequency Ordered Stop   07/17/20 1400  vancomycin (VANCOCIN) IVPB 1000 mg/200 mL premix     Discontinue     1,000 mg 200 mL/hr over 60 Minutes Intravenous Every 8 hours 07/17/20 0401     07/17/20 0330  vancomycin (VANCOREADY) IVPB 2000 mg/400 mL        2,000 mg 200 mL/hr over 120 Minutes Intravenous  Once 07/17/20 0317 07/17/20 0705   07/17/20 0315  vancomycin (VANCOCIN) IVPB 1000 mg/200 mL premix  Status:  Discontinued        1,000 mg 200 mL/hr over 60 Minutes Intravenous  Once 07/17/20 0303 07/17/20 0305   07/17/20 0315  vancomycin (VANCOCIN) IVPB 1000 mg/200 mL premix  Status:  Discontinued        1,000 mg 200 mL/hr over 60 Minutes Intravenous  Once 07/17/20 0307 07/17/20 0317   07/17/20 0315  cefTRIAXone (ROCEPHIN) 2 g in sodium chloride 0.9 % 100 mL IVPB        2 g 200 mL/hr over 30 Minutes Intravenous  Once 07/17/20 0307 07/17/20 0409       Assessment/Plan  Right posterior neck abscess. 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 S/p Incision and drainage and excisional debridement of right posterior neck abscess with scalpel and incision and drainage of left perineal abscess/hidradenitis by Dr. Andrey Campanile, 07/17/2020 - POD #1 - Continue abx. Cx's pending - BID WTD - Patient to have family try to come in today to learn how to do dressing changes at home  FEN - CM diet  VTE - SCDs, Subq heparin ID - Rocephin 8/2. Vanc 8/2 >> WBC 15.9. Tmax 100.    LOS: 1 day    Jacinto Halim , [facility] Surgery 07/18/2020, 10:39 AM Please see Amion for pager number during day hours 7:00am-4:30pm=

## 2020-07-18 NOTE — Progress Notes (Signed)
PROGRESS NOTE    Patient: Alexis Mathews                            PCP: Patient, No Pcp Per                    DOB: 04/07/1986            DOA: 07/16/2020 DGU:[id number]             DOS: 07/18/2020, 1:01 PM   LOS: 1 day   Date of Service: The patient was seen and examined on 07/18/2020  Subjective:   The patient was seen and examined this morning, remained stable no acute distress, afebrile normotensive. Patient was informed and educated regarding her newly diagnosed diabetes mellitus.  Patient status post I&D of posterior neck and left peritoneum abscess, packing in place with drain Patient tolerated procedure well No issues overnight.  Brief Narrative:   Alexis Mathews  is a 34 y.o. female, with history of HTN, Abscess, HS, and more presents to the ED with a c/c of Abscess. Patient reports that she first noticed the abscess developing 6 days ago. At that time it was small, non erythematous, and not painful. It continued to become gradually larger throughout the weak. Patient came in because the pain was too much for her to handle. Patient reports that she had tried tylenol at home, and it did help with the pain, but did not help with the instructions. At the pain's worst it was 10/10, after tylenol pain was 6/10. 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 Patient denies ever being told she has or is a carrier of MRSA.  Patient reports a previous abscess and I&D in the past.... Approximately a year ago.    Assessment & Plan:   Active Problems:   Cellulitis and abscess of neck   DM II (diabetes mellitus, type II), controlled (HCC)   Hyperglycemia   Sepsis (HCC)  Sepsis 2/2 Cellulitis -right neck abscess, left groin abscess -Currently stable afebrile normotensive  -On admission patient met the sepsis criteria on admission with a leukocytosis of 17.5, heart rate as high as 106, T-max 100.7, lactic acid 0.9, 1.4, >>>> 1.1 now -Per  sepsis protocol IV antibiotics and IV fluid resuscitation was initiated in ED -Blood cultures >> no growth to date -Intraoperative abscess fluid cultures >>  -Status post I&D of neck abscess, left groin abscess on 07/18/2019 -General surgery following, packing and drain in place on neck bleeding-open wound - -For cellulitis cellulitis order >> continue vancomycin monotherapy  -Status post I&D today 07/17/20-posterior neck region packing and drain placed   Hyperglycemia Newly diagnosed diabetes mellitus II -Hemoglobin A1c 6.8,  -CBG 163, 126  -No known history of diabetes mellitus, -We will check CBG QA CHS, SSI coverage -Initiating diabetic diet, education -Initiating p.o. Metformin 500 daily -Diabetic education (discussed with patient in detail)   Hypertension -Monitoring closely -As needed hydralazine -Patient may need antihypertensive meds upon discharge   Obesity -BMI 50.49, weight 125.3 failure -Discussed with patient regarding weight loss, early diabetic diet minus/HTN -Recommend aggressive weight loss-exercise-increase activity-modified diet   Nutritional status:            Cultures; Blood Cultures x 2 >> NGT Pending intraoperative abscess culture >>  Antimicrobials: -IV Rocephin 8/1 x 1 -IV vancomycin 8/1 >>>   Consultants: General surgery   ------------------------------------------------------------------------------------------------------------------------------------  DVT prophylaxis:  SCD/Compression stockings Code Status:   Code Status: Full Code Family Communication: No family member present at bedside The above findings and plan of care has been discussed with patient in detail,  they expressed understanding and agreement of above.  Admission status:    Status is: Inpatient  Remains inpatient appropriate because:Inpatient level of care appropriate due to severity of illness   Dispo: The patient is from: Home              Anticipated d/c  is to: Home              Anticipated d/c date is: 3 days              Patient currently is not medically stable to d/c.        Procedures:    Anticipated incision and drainage of posterior neck abscess, left groin abscess on 07/17/2020  Antimicrobials:  Anti-infectives (From admission, onward)   Start     Dose/Rate Route Frequency Ordered Stop   07/17/20 1400  vancomycin (VANCOCIN) IVPB 1000 mg/200 mL premix     Discontinue     1,000 mg 200 mL/hr over 60 Minutes Intravenous Every 8 hours 07/17/20 0401     07/17/20 0330  vancomycin (VANCOREADY) IVPB 2000 mg/400 mL        2,000 mg 200 mL/hr over 120 Minutes Intravenous  Once 07/17/20 0317 07/17/20 0705   07/17/20 0315  vancomycin (VANCOCIN) IVPB 1000 mg/200 mL premix  Status:  Discontinued        1,000 mg 200 mL/hr over 60 Minutes Intravenous  Once 07/17/20 0303 07/17/20 0305   07/17/20 0315  vancomycin (VANCOCIN) IVPB 1000 mg/200 mL premix  Status:  Discontinued        1,000 mg 200 mL/hr over 60 Minutes Intravenous  Once 07/17/20 0307 07/17/20 0317   07/17/20 0315  cefTRIAXone (ROCEPHIN) 2 g in sodium chloride 0.9 % 100 mL IVPB        2 g 200 mL/hr over 30 Minutes Intravenous  Once 07/17/20 0307 07/17/20 0409       Medication:  . acetaminophen  650 mg Oral Q8H  . heparin  5,000 Units Subcutaneous Q8H  . insulin aspart  0-9 Units Subcutaneous TID WC  . metFORMIN  500 mg Oral Q breakfast    hydrALAZINE, morphine injection, ondansetron **OR** ondansetron (ZOFRAN) IV, oxyCODONE, polyethylene glycol   Objective:   Vitals:   07/17/20 2027 07/18/20 0204 07/18/20 0524 07/18/20 0827  BP: (!) 154/87 (!) 143/85 (!) 152/82 (!) 143/75  Pulse: 94 73 67 (!) 56  Resp: _0 Temp: 97.7 F (36.5 C) 99.1 F (37.3 C) 98 F (36.7 C) 100 F (37.8 C)  TempSrc:  Oral  Oral  SpO2: 95% 97% 98% 97%  Weight:      Height:        Intake/Output Summary (Last 24 hours) at 07/18/2020 1301 Last data filed at 07/18/2020 0900 Gross per  24 hour  Intake 2003.29 ml  Output --  Net 2003.29 ml   Filed Weights   07/16/20 1724  Weight: (!) 129.3 kg     Examination:      Physical Exam:   General:  Alert, oriented, cooperative, no distress; wife at bedside  HEENT:  Normocephalic, PERRL, otherwise with in Normal limits   Neuro:  CNII-XII intact. , normal motor and sensation, reflexes intact   Lungs:   Clear to auscultation BL, Respirations unlabored, no wheezes /  crackles  Cardio:    S1/S2, RRR, No murmure, No Rubs or Gallops   Abdomen:   Soft, non-tender, bowel sounds active all four quadrants,  no guarding or peritoneal signs.  Muscular skeletal:  Limited exam - in bed, able to move all 4 extremities, Normal strength,  2+ pulses,  symmetric, No pitting edema  Skin:  Dry, warm to touch, negative for any Rashes, No open wounds   Wounds:  Skin/ wounds: Dry, warm to touch, negative for any Rashes, posterior neck abscess open, packing in place with drain,  Open  left perineal/lower buttocks abscess-draining-dressing in place ------------------------------------------------------------------------------------------------------------------------------------------    LABs:  CBC Latest Ref Rng & Units 07/18/2020 07/17/2020 07/16/2020  WBC 4.0 - 10.5 K/uL 15.9(H) 16.0(H) 17.5(H)  Hemoglobin 12.0 - 15.0 g/dL 12.2 12.0 12.8  Hematocrit 36 - 46 % 38.0 37.9 40.2  Platelets 150 - 400 K/uL 459(H) 429(H) 461(H)   CMP Latest Ref Rng & Units 07/18/2020 07/17/2020 07/17/2020  Glucose 70 - 99 mg/dL 229(H) 126(H) -  BUN 6 - 20 mg/dL 5(L) 6 -  Creatinine 0.44 - 1.00 mg/dL 0.72 0.67 0.75  Sodium 135 - 145 mmol/L 134(L) 134(L) -  Potassium 3.5 - 5.1 mmol/L 4.8 3.7 -  Chloride 98 - 111 mmol/L 98 99 -  CO2 22 - 32 mmol/L 27 26 -  Calcium 8.9 - 10.3 mg/dL 8.9 8.3(L) -  Total Protein 6.5 - 8.1 g/dL - 8.1 -  Total Bilirubin 0.3 - 1.2 mg/dL - 0.8 -  Alkaline Phos 38 - 126 U/L - 49 -  AST 15 - 41 U/L - 13(L) -  ALT 0 - 44 U/L - 12 -        Micro Results Recent Results (from the past 240 hour(s))  SARS Coronavirus 2 by RT PCR (hospital order, performed in [place] hospital lab) Nasopharyngeal Nasopharyngeal Swab     Status: None   Collection Time: 07/17/20  3:40 AM   Specimen: Nasopharyngeal Swab  Result Value Ref Range Status   SARS Coronavirus 2 NEGATIVE NEGATIVE Final    Comment: (NOTE) SARS-CoV-2 target nucleic acids are NOT DETECTED.  The SARS-CoV-2 RNA is generally detectable in upper and lower respiratory specimens during the acute phase of infection. The lowest concentration of SARS-CoV-2 viral copies this assay can detect is 250 copies / mL. A negative result does not preclude SARS-CoV-2 infection and should not be used as the sole basis for treatment or other patient management decisions.  A negative result may occur with improper specimen collection / handling, submission of specimen other than nasopharyngeal swab, presence of viral mutation(s) within the areas targeted by this assay, and inadequate number of viral copies (<250 copies / mL). A negative result must be combined with clinical observations, patient history, and epidemiological information.  Fact Sheet for Patients:   StrictlyIdeas.no  Fact Sheet for Healthcare Providers: BankingDealers.co.za  This test is not yet approved or  cleared by the [place] FDA and has been authorized for detection and/or diagnosis of SARS-CoV-2 by FDA under an Emergency Use Authorization (EUA).  This EUA will remain in effect (meaning this test can be used) for the duration of the COVID-19 declaration under Section 564(b)(1) of the Act, 21 U.S.C. section 360bbb-3(b)(1), unless the authorization is terminated or revoked sooner.  Performed at [facility], [place] [address], [place], [place] [address]   Aerobic/Anaerobic Culture (surgical/deep wound)     Status: None (Preliminary result)    Collection Time: 07/17/20  4:49  PM   Specimen: PATH Other; Tissue  Result Value Ref Range Status   Specimen Description ABSCESS  Final   Special Requests NECK SAMPLE A  Final   Gram Stain   Final    ABUNDANT WBC PRESENT, PREDOMINANTLY PMN ABUNDANT GRAM POSITIVE COCCI ABUNDANT GRAM NEGATIVE RODS MODERATE GRAM POSITIVE RODS    Culture   Final    CULTURE REINCUBATED FOR BETTER GROWTH Performed at [facility], [place] [address], [place], [place] [address]    Report Status PENDING  Incomplete  Aerobic/Anaerobic Culture (surgical/deep wound)     Status: None (Preliminary result)   Collection Time: 07/17/20  4:51 PM   Specimen: PATH Other; Tissue  Result Value Ref Range Status   Specimen Description ABSCESS  Final   Special Requests GROIN SAMPLE B  Final   Gram Stain   Final    ABUNDANT WBC PRESENT, PREDOMINANTLY PMN FEW GRAM POSITIVE COCCI    Culture   Final    NO GROWTH < 24 HOURS Performed at [facility], [place] [address] [place], [place] [address]    Report Status PENDING  Incomplete    Radiology Reports CT Soft Tissue Neck W Contrast  Result Date: 07/17/2020 CLINICAL DATA:  Initial evaluation for right-sided neck abscess. EXAM: CT NECK WITH CONTRAST TECHNIQUE: Multidetector CT imaging of the neck was performed using the standard protocol following the bolus administration of intravenous contrast. CONTRAST:  65m OMNIPAQUE IOHEXOL 300 MG/ML  SOLN COMPARISON:  None. FINDINGS: Pharynx and larynx: Oral cavity within normal limits. Few scattered dental caries noted without acute inflammatory changes about the teeth. Palatine tonsils symmetric and within normal limits. Parapharyngeal fat maintained. Remainder of the oropharynx and nasopharynx within normal limits. No retropharyngeal collection. Normal epiglottis. Remainder of the hypopharynx and supraglottic larynx within normal limits. True cords symmetric and normal. Subglottic airway clear. Salivary glands: Salivary glands  including the parotid and submandibular glands are normal. Thyroid: Normal. Lymph nodes: Enlarged bilateral posterior chain/level 5 lymph nodes noted bilaterally, measuring up to 15 mm on the right and 14 mm on the left. Prominent supraclavicular nodes measure up to 10 mm. Findings presumably reactive. No other adenopathy within the neck. Vascular: Normal intravascular enhancement seen within the neck. Limited intracranial: Unremarkable. Visualized orbits: Unremarkable. Mastoids and visualized paranasal sinuses: Visualized paranasal sinuses are largely clear. Visualize mastoids and middle ear cavities are well pneumatized and free of fluid. Skeleton: No acute osseous finding. No discrete or worrisome osseous lesions. Upper chest: Visualized upper chest demonstrates no acute finding. Partially visualized lungs are grossly clear. Other: There is an irregular multiloculated rim enhancing collection positioned within the central and right posterior neck, consistent with abscess. Collection measures 8.5 x 4.9 x 4.2 cm in greatest transaxial dimensions (transverse by AP by craniocaudad). Surrounding inflammatory stranding within the adjacent subcutaneous fat with overlying skin thickening, compatible with regional cellulitis. Stranding involves the underlying posterior paraspinous musculature. No extension into the deeper spaces of the neck or spinal canal at this time. IMPRESSION: 1. 8.5 x 4.9 x 4.2 cm multiloculated rim enhancing collection within the central and right posterior neck, consistent with abscess. Surrounding inflammatory stranding within the adjacent subcutaneous fat consistent with regional cellulitis. 2. Enlarged bilateral posterior chain/level 5 lymph nodes, presumably reactive. Electronically Signed   By: BJeannine BogaM.D.   On: 07/17/2020 02:43    SIGNED: SDeatra James MD, FACP, FHM. [facility],  Pager (please use [email or web address] to page/text)  If 7PM-7AM, please contact  night-coverage  [email or web address], Password [facility] 07/18/2020, 1:01 PM

## 2020-07-19 ENCOUNTER — Encounter: Payer: Self-pay | Admitting: Physician Assistant

## 2020-07-19 ENCOUNTER — Encounter (HOSPITAL_COMMUNITY): Payer: Self-pay

## 2020-07-19 LAB — CBC
HCT: 35.5 % — ABNORMAL LOW (ref 36.0–46.0)
Hemoglobin: 11.3 g/dL — ABNORMAL LOW (ref 12.0–15.0)
MCH: 28 pg (ref 26.0–34.0)
MCHC: 31.8 g/dL (ref 30.0–36.0)
MCV: 88.1 fL (ref 80.0–100.0)
Platelets: 469 10*3/uL — ABNORMAL HIGH (ref 150–400)
RBC: 4.03 MIL/uL (ref 3.87–5.11)
RDW: 13 % (ref 11.5–15.5)
WBC: 12.7 10*3/uL — ABNORMAL HIGH (ref 4.0–10.5)
nRBC: 0 % (ref 0.0–0.2)

## 2020-07-19 LAB — BASIC METABOLIC PANEL
Anion gap: 10 (ref 5–15)
BUN: 11 mg/dL (ref 6–20)
CO2: 26 mmol/L (ref 22–32)
Calcium: 8.6 mg/dL — ABNORMAL LOW (ref 8.9–10.3)
Chloride: 100 mmol/L (ref 98–111)
Creatinine, Ser: 0.69 mg/dL (ref 0.44–1.00)
GFR calc Af Amer: >60 mL/min (ref >60)
GFR calc non Af Amer: >60 mL/min (ref >60)
Glucose, Bld: 127 mg/dL — ABNORMAL HIGH (ref 70–99)
Potassium: 3.9 mmol/L (ref 3.5–5.1)
Sodium: 136 mmol/L (ref 135–145)

## 2020-07-19 LAB — GLUCOSE, CAPILLARY
Glucose-Capillary: 119 mg/dL — ABNORMAL HIGH (ref 70–99)
Glucose-Capillary: 111 mg/dL — ABNORMAL HIGH (ref 70–99)

## 2020-07-19 MED ORDER — BLOOD GLUCOSE METER KIT: PACK | 0 refills | Status: AC

## 2020-07-19 MED ORDER — SULFAMETHOXAZOLE-TRIMETHOPRIM 800-160 MG PO TABS
1.0000 | ORAL_TABLET | Freq: Two times a day (BID) | ORAL | 0 refills | Status: DC
Start: 2020-07-19 — End: 2020-08-24

## 2020-07-19 MED ORDER — BLOOD GLUCOSE MONITOR KIT: PACK | 0 refills | Status: AC

## 2020-07-19 MED ORDER — OXYCODONE HCL 5 MG PO TABS: 5.0000 mg | ORAL_TABLET | Freq: Four times a day (QID) | ORAL | 0 refills | Status: DC | PRN

## 2020-07-19 MED ORDER — METFORMIN HCL 500 MG PO TABS: 500.0000 mg | ORAL_TABLET | Freq: Every day | ORAL | 0 refills | Status: DC

## 2020-07-19 NOTE — Progress Notes (Signed)
2 Days Post-Op  Subjective: CC: Patient reports soreness over wounds with packing changes. She reports her sister does not feel comfortable with dressing changes at home. She does not have anyone else that can help her with dressing changes at home.   Objective: Vital signs in last 24 hours: Temp:  [97.4 F (36.3 C)-98.4 F (36.9 C)] 97.5 F (36.4 C) (08/04 1009) Pulse Rate:  [58-76] 58 (08/04 1009) Resp:  [16-18] 17 (08/04 1009) BP: (155-163)/(73-91) 162/73 (08/04 1009) SpO2:  [98 %-100 %] 100 % (08/04 1009) Last BM Date: 07/16/20  Intake/Output from previous day: 08/03 0701 - 08/04 0700 In: 720 [P.O.:720] Out: -  Intake/Output this shift: No intake/output data recorded.  PE: Neck: right posterior neck packing removed. Wound edges are healthy without erythema, induration. Base of the wound with granulation and cauterized. Some thin bloody drainage. Penrose drain is in place. Repacked. No further areas of fluctuance.  Left buttock/perineum: Packing removed.  Wound edges are healthy without erythema, induration. Base of the wound with granulation and cauterized. No drainage. Repacked. No further areas of fluctuance.   Lab Results:  Recent Labs    07/18/20 0048 07/19/20 0316  WBC 15.9* 12.7*  HGB 12.2 11.3*  HCT 38.0 35.5*  PLT 459* 469*   BMET Recent Labs    07/18/20 0048 07/19/20 0316  NA 134* 136  K 4.8 3.9  CL 98 100  CO2 27 26  GLUCOSE 229* 127*  BUN 5* 11  CREATININE 0.72 0.69  CALCIUM 8.9 8.6*   PT/INR No results for input(s): LABPROT, INR in the last 72 hours. CMP     Component Value Date/Time   NA 136 07/19/2020 0316   K 3.9 07/19/2020 0316   CL 100 07/19/2020 0316   CO2 26 07/19/2020 0316   GLUCOSE 127 (H) 07/19/2020 0316   BUN 11 07/19/2020 0316   CREATININE 0.69 07/19/2020 0316   CALCIUM 8.6 (L) 07/19/2020 0316   PROT 8.1 07/17/2020 0714   ALBUMIN 2.6 (L) 07/17/2020 0714   AST 13 (L) 07/17/2020 0714   ALT 12 07/17/2020 0714    ALKPHOS 49 07/17/2020 0714   BILITOT 0.8 07/17/2020 0714   GFRNONAA >60 07/19/2020 0316   GFRAA >60 07/19/2020 0316   Lipase  No results found for: LIPASE     Studies/Results: No results found.  Anti-infectives: Anti-infectives (From admission, onward)   Start     Dose/Rate Route Frequency Ordered Stop   07/17/20 1400  vancomycin (VANCOCIN) IVPB 1000 mg/200 mL premix     Discontinue     1,000 mg 200 mL/hr over 60 Minutes Intravenous Every 8 hours 07/17/20 0401     07/17/20 0330  vancomycin (VANCOREADY) IVPB 2000 mg/400 mL        2,000 mg 200 mL/hr over 120 Minutes Intravenous  Once 07/17/20 0317 07/17/20 0705   07/17/20 0315  vancomycin (VANCOCIN) IVPB 1000 mg/200 mL premix  Status:  Discontinued        1,000 mg 200 mL/hr over 60 Minutes Intravenous  Once 07/17/20 0303 07/17/20 0305   07/17/20 0315  vancomycin (VANCOCIN) IVPB 1000 mg/200 mL premix  Status:  Discontinued        1,000 mg 200 mL/hr over 60 Minutes Intravenous  Once 07/17/20 0307 07/17/20 0317   07/17/20 0315  cefTRIAXone (ROCEPHIN) 2 g in sodium chloride 0.9 % 100 mL IVPB        2 g 200 mL/hr over 30 Minutes Intravenous  Once 07/17/20 0307 07/17/20  [id number]       Assessment/Plan New onset DM2 HTN  Right posterior neck abscess. 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 S/p Incision and drainage and excisional debridement of right posterior neck abscess with scalpel and incision and drainage of left perineal abscess/hidradenitis by Dr. Andrey Campanile, 07/17/2020 - POD #2 - Continue abx. Cx's pending. Few gram + cocci growing. No growth < 24 hours.  - BID WTD - Will discuss with MD about options for dressing changes at home. I am having TOC see to see if she is a candidate for HH  FEN - CM diet  VTE - SCDs, Subq heparin ID - Rocephin 8/2. Vanc 8/2 >>   LOS: 2 days    Jacinto Halim , [facility] 07/19/2020, 11:09 AM Please see Amion for pager number during day hours  7:00am-4:30pm

## 2020-07-19 NOTE — Progress Notes (Signed)
Wound care education provided to sister at bedside; verbal teach back confirmed understanding.  Discharge instructions reviewed at bedside with patient and sister.  Both verbalized understanding and declined further education.  Patient transported off unit via wheelchair and staff supervision for discharge.

## 2020-07-19 NOTE — Progress Notes (Signed)
Wound care provided per order and tolerated well.  Will continue to monitor. °

## 2020-07-19 NOTE — Discharge Summary (Signed)
Alexis Mathews FGH:[id number] DOB: 1986-02-17 DOA: 07/16/2020  PCP: Patient, No Pcp Per  Admit date: 07/16/2020 Discharge date: 07/19/2020  Admitted From: home Disposition:  home  Recommendations for Outpatient Follow-up:  1. Follow up with PCP in 1 week 2. Please obtain BMP/CBC in one week Follow-up with general surgery within 1 week     Discharge Condition:Stable CODE STATUS: Full Diet recommendation: Carb control Brief/Interim Summary: Alexis Mathews  is a 34 y.o. female, with history of HTN, Abscess, HS, and more presents to the ED with a c/c of Abscess. Patient reports that she first noticed the abscess developing 6 days ago. At that time it was small, non erythematous, and not painful. It continued to become gradually larger throughout the week.  She was found with a neck abscess.  General surgery was consulted.  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 -S/pIncision and drainage and excisional debridement of right posterior neck abscess with scalpeland incision and drainage of left perineal abscess/hidradenitis by Dr. Andrey Campanile, 07/17/2020 Afebrile.  Leukocytosis down to 12.7 now. Final abscess culture from the neck and groin are pending. Cleared by surgery to be discharged on Bactrim, they wrote for the prescription. Follow-up with general surgery as outpatient. Dressing change as directed by general surgery Unfortunately unable to get home health for the patient per case management    Hyperglycemia Newly diagnosed diabetes mellitus II -Hemoglobin A1c 6.8,  -No known history of diabetes mellitus, -Needs to continue carb control diabetic diet Diabetic educator was consulted -Initiated p.o. Metformin 500 daily -was told to f/u with pcp for further management and ck bgc   Hypertension Needs f/u with pcp for further mx  Obesity -BMI 50.49, weight 125.3 failure -Discussed with patient regarding weight loss, early diabetic diet  minus/HTN -Recommend aggressive weight loss-exercise-increase activity-modified diet    Discharge Diagnoses:  Active Problems:   Cellulitis and abscess of neck   DM II (diabetes mellitus, type II), controlled (HCC)   Hyperglycemia   Sepsis (HCC)    Discharge Instructions  Discharge Instructions    Amb Referral to Nutrition and Diabetic E   Complete by: As directed    Call MD for:  temperature >100.4   Complete by: As directed    Change dressing (specify)   Complete by: As directed    Dressing change: as directed by surgeons   Diet - low sodium heart healthy   Complete by: As directed    Diet Carb Modified   Complete by: As directed    Discharge instructions   Complete by: As directed    You need a primary care for your diabetes Need to check your blood glucose levels Follow-up with general surgery in 1 week   Increase activity slowly   Complete by: As directed      Allergies as of 07/19/2020   No Known Allergies     Medication List    STOP taking these medications   acetaminophen 500 MG tablet Commonly known as: TYLENOL     TAKE these medications   metFORMIN 500 MG tablet Commonly known as: GLUCOPHAGE Take 1 tablet (500 mg total) by mouth daily with breakfast. Start taking on: July 20, 2020   oxyCODONE 5 MG immediate release tablet Commonly known as: Roxicodone Take 1 tablet (5 mg total) by mouth every 6 (six) hours as needed.   sulfamethoxazole-trimethoprim 800-160 MG tablet Commonly known as: BACTRIM DS Take 1 tablet by mouth 2 (two) times daily.  Discharge Care Instructions  (From admission, onward)         Start     Ordered   07/19/20 0000  Change dressing (specify)       Comments: Dressing change: as directed by surgeons   07/19/20 1430          Follow-up Information    Surgery, [place]. Go on 08/01/2020.   Specialty: General Surgery Why: 1030am. Please bring a copy of your photo ID and insurnace card. Please  arrive 30 minutes prior to your appointment for paperwork.  Contact information: [address] [place] [address] [phone]              No Known Allergies  Consultations:  gsx   Procedures/Studies: CT Soft Tissue Neck W Contrast  Result Date: 07/17/2020 CLINICAL DATA:  Initial evaluation for right-sided neck abscess. EXAM: CT NECK WITH CONTRAST TECHNIQUE: Multidetector CT imaging of the neck was performed using the standard protocol following the bolus administration of intravenous contrast. CONTRAST:  14mL OMNIPAQUE IOHEXOL 300 MG/ML  SOLN COMPARISON:  None. FINDINGS: Pharynx and larynx: Oral cavity within normal limits. Few scattered dental caries noted without acute inflammatory changes about the teeth. Palatine tonsils symmetric and within normal limits. Parapharyngeal fat maintained. Remainder of the oropharynx and nasopharynx within normal limits. No retropharyngeal collection. Normal epiglottis. Remainder of the hypopharynx and supraglottic larynx within normal limits. True cords symmetric and normal. Subglottic airway clear. Salivary glands: Salivary glands including the parotid and submandibular glands are normal. Thyroid: Normal. Lymph nodes: Enlarged bilateral posterior chain/level 5 lymph nodes noted bilaterally, measuring up to 15 mm on the right and 14 mm on the left. Prominent supraclavicular nodes measure up to 10 mm. Findings presumably reactive. No other adenopathy within the neck. Vascular: Normal intravascular enhancement seen within the neck. Limited intracranial: Unremarkable. Visualized orbits: Unremarkable. Mastoids and visualized paranasal sinuses: Visualized paranasal sinuses are largely clear. Visualize mastoids and middle ear cavities are well pneumatized and free of fluid. Skeleton: No acute osseous finding. No discrete or worrisome osseous lesions. Upper chest: Visualized upper chest demonstrates no acute finding. Partially visualized lungs are  grossly clear. Other: There is an irregular multiloculated rim enhancing collection positioned within the central and right posterior neck, consistent with abscess. Collection measures 8.5 x 4.9 x 4.2 cm in greatest transaxial dimensions (transverse by AP by craniocaudad). Surrounding inflammatory stranding within the adjacent subcutaneous fat with overlying skin thickening, compatible with regional cellulitis. Stranding involves the underlying posterior paraspinous musculature. No extension into the deeper spaces of the neck or spinal canal at this time. IMPRESSION: 1. 8.5 x 4.9 x 4.2 cm multiloculated rim enhancing collection within the central and right posterior neck, consistent with abscess. Surrounding inflammatory stranding within the adjacent subcutaneous fat consistent with regional cellulitis. 2. Enlarged bilateral posterior chain/level 5 lymph nodes, presumably reactive. Electronically Signed   By: Rise Mu M.D.   On: 07/17/2020 02:43       Subjective: No complaints.  Discharge Exam: Vitals:   07/19/20 1009 07/19/20 1344  BP: (!) 162/73 (!) 161/82  Pulse: (!) 58 62  Resp: 17   Temp: (!) 97.5 F (36.4 C) 97.7 F (36.5 C)  SpO2: 100% 99%   Vitals:   07/19/20 0129 07/19/20 0520 07/19/20 1009 07/19/20 1344  BP: (!) 159/87 (!) 156/84 (!) 162/73 (!) 161/82  Pulse: (!) 59 (!) 59 (!) 58 62  Resp: 17 16 17    Temp: 98.4 F (36.9 C) (!) 97.4 F (  36.3 C) (!) 97.5 F (36.4 C) 97.7 F (36.5 C)  TempSrc: Oral  Oral Oral  SpO2: 99% 100% 100% 99%  Weight:      Height:        General: Pt is alert, awake, not in acute distress Neck : right wound without drainage. Cardiovascular: RRR, S1/S2 +, no rubs, no gallops Respiratory: CTA bilaterally, no wheezing, no rhonchi Abdominal: Soft, NT, ND, bowel sounds + Extremities: no edema, no cyanosis    The results of significant diagnostics from this hospitalization (including imaging, microbiology, ancillary and laboratory) are  listed below for reference.     Microbiology: Recent Results (from the past 240 hour(s))  SARS Coronavirus 2 by RT PCR (hospital order, performed in [facility] hospital lab) Nasopharyngeal Nasopharyngeal Swab     Status: None   Collection Time: 07/17/20  3:40 AM   Specimen: Nasopharyngeal Swab  Result Value Ref Range Status   SARS Coronavirus 2 NEGATIVE NEGATIVE Final    Comment: (NOTE) SARS-CoV-2 target nucleic acids are NOT DETECTED.  The SARS-CoV-2 RNA is generally detectable in upper and lower respiratory specimens during the acute phase of infection. The lowest concentration of SARS-CoV-2 viral copies this assay can detect is 250 copies / mL. A negative result does not preclude SARS-CoV-2 infection and should not be used as the sole basis for treatment or other patient management decisions.  A negative result may occur with improper specimen collection / handling, submission of specimen other than nasopharyngeal swab, presence of viral mutation(s) within the areas targeted by this assay, and inadequate number of viral copies (<250 copies / mL). A negative result must be combined with clinical observations, patient history, and epidemiological information.  Fact Sheet for Patients:   BoilerBrush.com.cy  Fact Sheet for Healthcare Providers: https://pope.com/  This test is not yet approved or  cleared by the [place] FDA and has been authorized for detection and/or diagnosis of SARS-CoV-2 by FDA under an Emergency Use Authorization (EUA).  This EUA will remain in effect (meaning this test can be used) for the duration of the COVID-19 declaration under Section 564(b)(1) of the Act, 21 U.S.C. section 360bbb-3(b)(1), unless the authorization is terminated or revoked sooner.  Performed at [facility], [address] [place], [place] [address]   Culture, blood (single)     Status: None (Preliminary result)    Collection Time: 07/17/20  4:05 AM   Specimen: BLOOD  Result Value Ref Range Status   Specimen Description BLOOD RIGHT ANTECUBITAL  Final   Special Requests   Final    BOTTLES DRAWN AEROBIC AND ANAEROBIC Blood Culture adequate volume   Culture   Final    NO GROWTH 2 DAYS Performed at [facility], [address], [place], [place] [address]    Report Status PENDING  Incomplete  Aerobic/Anaerobic Culture (surgical/deep wound)     Status: None (Preliminary result)   Collection Time: 07/17/20  4:49 PM   Specimen: PATH Other; Tissue  Result Value Ref Range Status   Specimen Description ABSCESS  Final   Special Requests NECK SAMPLE A  Final   Gram Stain   Final    ABUNDANT WBC PRESENT, PREDOMINANTLY PMN ABUNDANT GRAM POSITIVE COCCI ABUNDANT GRAM NEGATIVE RODS MODERATE GRAM POSITIVE RODS    Culture   Final    FEW STREPTOCOCCUS AGALACTIAE TESTING AGAINST S. AGALACTIAE NOT ROUTINELY PERFORMED DUE TO PREDICTABILITY OF AMP/PEN/VAN SUSCEPTIBILITY. HOLDING FOR POSSIBLE ANAEROBE Performed at [facility], [address] [place], [place] [address]    Report Status PENDING  Incomplete  Aerobic/Anaerobic Culture (surgical/deep wound)     Status: None (Preliminary result)   Collection Time: 07/17/20  4:51 PM   Specimen: PATH Other; Tissue  Result Value Ref Range Status   Specimen Description ABSCESS  Final   Special Requests GROIN SAMPLE B  Final   Gram Stain   Final    ABUNDANT WBC PRESENT, PREDOMINANTLY PMN FEW GRAM POSITIVE COCCI    Culture   Final    HOLDING FOR POSSIBLE ANAEROBE Performed at [facility], [address] [place], [place] [address]    Report Status PENDING  Incomplete     Labs: BNP (last 3 results) No results for input(s): BNP in the last 8760 hours. Basic Metabolic Panel: Recent Labs  Lab 07/16/20 1817 07/17/20 0714 07/18/20 0048 07/19/20 0316  NA 135 134* 134* 136  K 4.0 3.7 4.8 3.9  CL 100 99 98 100  CO2 25 26 27 26   GLUCOSE 163*  126* 229* 127*  BUN 8 6 5* 11  CREATININE 0.71 0.67   0.75 0.72 0.69  CALCIUM 8.8* 8.3* 8.9 8.6*  MG  --  1.7  --   --    Liver Function Tests: Recent Labs  Lab 07/16/20 1817 07/17/20 0714  AST 13* 13*  ALT 15 12  ALKPHOS 56 49  BILITOT 0.6 0.8  PROT 9.0* 8.1  ALBUMIN 3.1* 2.6*   No results for input(s): LIPASE, AMYLASE in the last 168 hours. No results for input(s): AMMONIA in the last 168 hours. CBC: Recent Labs  Lab 07/16/20 1817 07/17/20 0715 07/18/20 0048 07/19/20 0316  WBC 17.5* 16.0* 15.9* 12.7*  NEUTROABS 13.6* 11.5*  --   --   HGB 12.8 12.0 12.2 11.3*  HCT 40.2 37.9 38.0 35.5*  MCV 88.5 89.4 87.8 88.1  PLT 461* 429* 459* 469*   Cardiac Enzymes: No results for input(s): CKTOTAL, CKMB, CKMBINDEX, TROPONINI in the last 168 hours. BNP: Invalid input(s): POCBNP CBG: Recent Labs  Lab 07/18/20 1158 07/18/20 1627 07/18/20 2120 07/19/20 0748 07/19/20 1140  GLUCAP 229* 177* 117* 119* 111*   D-Dimer No results for input(s): DDIMER in the last 72 hours. Hgb A1c Recent Labs    07/17/20 0714  HGBA1C 6.8*   Lipid Profile No results for input(s): CHOL, HDL, LDLCALC, TRIG, CHOLHDL, LDLDIRECT in the last 72 hours. Thyroid function studies No results for input(s): TSH, T4TOTAL, T3FREE, THYROIDAB in the last 72 hours.  Invalid input(s): FREET3 Anemia work up No results for input(s): VITAMINB12, FOLATE, FERRITIN, TIBC, IRON, RETICCTPCT in the last 72 hours. Urinalysis No results found for: COLORURINE, APPEARANCEUR, LABSPEC, PHURINE, GLUCOSEU, HGBUR, BILIRUBINUR, KETONESUR, PROTEINUR, UROBILINOGEN, NITRITE, LEUKOCYTESUR Sepsis Labs Invalid input(s): PROCALCITONIN,  WBC,  LACTICIDVEN Microbiology Recent Results (from the past 240 hour(s))  SARS Coronavirus 2 by RT PCR (hospital order, performed in [facility] hospital lab) Nasopharyngeal Nasopharyngeal Swab     Status: None   Collection Time: 07/17/20  3:40 AM   Specimen: Nasopharyngeal Swab  Result Value  Ref Range Status   SARS Coronavirus 2 NEGATIVE NEGATIVE Final    Comment: (NOTE) SARS-CoV-2 target nucleic acids are NOT DETECTED.  The SARS-CoV-2 RNA is generally detectable in upper and lower respiratory specimens during the acute phase of infection. The lowest concentration of SARS-CoV-2 viral copies this assay can detect is 250 copies / mL. A negative result does not preclude SARS-CoV-2 infection and should not be used as the sole  basis for treatment or other patient management decisions.  A negative result may occur with improper specimen collection / handling, submission of specimen other than nasopharyngeal swab, presence of viral mutation(s) within the areas targeted by this assay, and inadequate number of viral copies (<250 copies / mL). A negative result must be combined with clinical observations, patient history, and epidemiological information.  Fact Sheet for Patients:   BoilerBrush.com.cyhttps://www.fda.gov/media/136312/download  Fact Sheet for Healthcare Providers: https://pope.com/https://www.fda.gov/media/136313/download  This test is not yet approved or  cleared by the [place] FDA and has been authorized for detection and/or diagnosis of SARS-CoV-2 by FDA under an Emergency Use Authorization (EUA).  This EUA will remain in effect (meaning this test can be used) for the duration of the COVID-19 declaration under Section 564(b)(1) of the Act, 21 U.S.C. section 360bbb-3(b)(1), unless the authorization is terminated or revoked sooner.  Performed at [facility], [address] [place], [place] [address]   Culture, blood (single)     Status: None (Preliminary result)   Collection Time: 07/17/20  4:05 AM   Specimen: BLOOD  Result Value Ref Range Status   Specimen Description BLOOD RIGHT ANTECUBITAL  Final   Special Requests   Final    BOTTLES DRAWN AEROBIC AND ANAEROBIC Blood Culture adequate volume   Culture   Final    NO GROWTH 2 DAYS Performed at [facility], [address] [place], [place] [address]    Report Status PENDING  Incomplete  Aerobic/Anaerobic Culture (surgical/deep wound)     Status: None (Preliminary result)   Collection Time: 07/17/20  4:49 PM   Specimen: PATH Other; Tissue  Result Value Ref Range Status   Specimen Description ABSCESS  Final   Special Requests NECK SAMPLE A  Final   Gram Stain   Final    ABUNDANT WBC PRESENT, PREDOMINANTLY PMN ABUNDANT GRAM POSITIVE COCCI ABUNDANT GRAM NEGATIVE RODS MODERATE GRAM POSITIVE RODS    Culture   Final    FEW STREPTOCOCCUS AGALACTIAE TESTING AGAINST S. AGALACTIAE NOT ROUTINELY PERFORMED DUE TO PREDICTABILITY OF AMP/PEN/VAN SUSCEPTIBILITY. HOLDING FOR POSSIBLE ANAEROBE Performed at [facility], [address] [place], [place] [address]    Report Status PENDING  Incomplete  Aerobic/Anaerobic Culture (surgical/deep wound)     Status: None (Preliminary result)   Collection Time: 07/17/20  4:51 PM   Specimen: PATH Other; Tissue  Result Value Ref Range Status   Specimen Description ABSCESS  Final   Special Requests GROIN SAMPLE B  Final   Gram Stain   Final    ABUNDANT WBC PRESENT, PREDOMINANTLY PMN FEW GRAM POSITIVE COCCI    Culture   Final    HOLDING FOR POSSIBLE ANAEROBE Performed at [facility], [address] [place], [place] [address]    Report Status PENDING  Incomplete     Time coordinating discharge: Over 30 minutes  SIGNED:   Lynn ItoSahar Whitley Strycharz, MD  [facility] 07/19/2020, 3:20 PM Pager   If 7PM-7AM, please contact night-coverage [email or web address] Password [email or web address]

## 2020-07-19 NOTE — Progress Notes (Signed)
VAST consulted to obtain IV access for vancomycin. Upon arrival to pt's room, pt's nurse Marchelle Folks advised pt no longer needs IV as antibiotics have been changed to PO route.

## 2020-07-19 NOTE — Progress Notes (Signed)
I spoke with patients sister Coralie Common who feels comfortable with doing revised wound care recommendations. She will come and learn how to do them today. Will reach out to primary team to let them know that patient is okay for d/c from a general surgery standpoint.

## 2020-07-19 NOTE — TOC Initial Note (Addendum)
Transition of Care [facility]) - Initial/Assessment Note    Patient Details  Name: Alexis Mathews MRN: [id number] Date of Birth: 11-21-1986  Transition of Care [facility]) CM/SW Contact:    Kingsley Plan, RN Phone Number: 07/19/2020, 1:05 PM  Clinical Narrative:                 Patient from home.   Does not have a PCP. Explained patient can call number on her insurance card and be provided a complete list of providers in network. Patient voiced understanding.   Received orders for home health RN for BID dressing changes. Provided patient with medicare.gov list of home health agencies.   Discussed with patient. Home health requires the patient to have a support person for dressing changes since [facility] does not visit every day. Patient unable to reach wounds herself to do dressing changes. The only relative she has is her sister. Patient states her sister can help clean wounds but does not feel comfortable packing wounds.   Patient asking if she can go to [facility] office for daily dressing changes. NCM will discuss with [facility] PA, however, dressing are BID and [facility] office closed on weekends.   Discussed with Casimiro Needle PA , will will discuss with patient.   Await call back after discussion.  1430 Sister now agreeable to assist with wound care according to notes. Discussed with patient. Patient has no preference regarding home health agency. NCM will start calling.  Cory with [facility] unable to accept due to staffing.   Clydie Braun with [facility] unable to accept due to staffing.   Drew with [facility] unable to accept due to staffing.  Cheryl with [facility] unable to accept due to staffing.   Cassie with [facility] unable to accept due to not being in network with patient's insurance.   [place] with [facility] unable to accept due to staffing.   Eber Jones with [facility] unable to accept due to not being in network with patient's insurance.   Sarah with [facility] unable to accept due to  staffing.  Okey Regal with [facility] unable to accept due to staffing.   Tiffany with [facility] unable to accept due to staffing.  NCM has exhausted the list of home health agencies for patient's address. NCM explained to patient. Bedside nurse will do wound care education with patient and sister. NCM secure chatted Casimiro Needle PA and DR Marylu Lund   Expected Discharge Plan: Home w Home Health Services Barriers to Discharge: Continued Medical Work up   Patient Goals and CMS Choice Patient states their goals for this hospitalization and ongoing recovery are:: to return to home CMS Medicare.gov Compare Post Acute Care list provided to:: Patient Choice offered to / list presented to : Patient  Expected Discharge Plan and Services Expected Discharge Plan: Home w Home Health Services   Discharge Planning Services: CM Consult Post Acute Care Choice: Home Health Living arrangements for the past 2 months: Single Family Home                 DME Arranged: N/A         HH Arranged: RN          Prior Living Arrangements/Services Living arrangements for the past 2 months: Single Family Home   Patient language and need for interpreter reviewed:: Yes Do you feel safe going back to the place where you live?: Yes            Criminal Activity/Legal Involvement Pertinent to Current Situation/Hospitalization:  No - Comment as needed  Activities of Daily Living Home Assistive Devices/Equipment: Blood pressure cuff ADL Screening (condition at time of admission) Patient's cognitive ability adequate to safely complete daily activities?: Yes Is the patient deaf or have difficulty hearing?: No Does the patient have difficulty seeing, even when wearing glasses/contacts?: No Does the patient have difficulty concentrating, remembering, or making decisions?: No Patient able to express need for assistance with ADLs?: Yes Does the patient have difficulty dressing or bathing?: No Independently performs  ADLs?: Yes (appropriate for developmental age) Does the patient have difficulty walking or climbing stairs?: No Weakness of Legs: None Weakness of Arms/Hands: None  Permission Sought/Granted   Permission granted to share information with : No              Emotional Assessment Appearance:: Appears stated age Attitude/Demeanor/Rapport: Engaged Affect (typically observed): Accepting Orientation: : Oriented to Self, Oriented to Place, Oriented to  Time, Oriented to Situation Alcohol / Substance Use: Not Applicable Psych Involvement: No (comment)  Admission diagnosis:  Cellulitis and abscess of neck [L03.221, L02.11] Neck abscess [L02.11] Sepsis, due to unspecified organism, unspecified whether acute organ dysfunction present [facility]) [A41.9] Patient Active Problem List   Diagnosis Date Noted  . Cellulitis and abscess of neck 07/17/2020  . DM II (diabetes mellitus, type II), controlled (HCC) 07/17/2020  . Hyperglycemia 07/17/2020  . Sepsis (HCC) 07/17/2020   PCP:  Patient, No Pcp Per Pharmacy:   [id number] [place] Otto, [place] - [address] AT [facility] [address] [place] [address] Phone: [phone] Fax: [phone]     Social Determinants of Health (SDOH) Interventions    Readmission Risk Interventions No flowsheet data found.

## 2020-07-20 LAB — AEROBIC/ANAEROBIC CULTURE W GRAM STAIN (SURGICAL/DEEP WOUND)

## 2020-07-21 LAB — AEROBIC/ANAEROBIC CULTURE W GRAM STAIN (SURGICAL/DEEP WOUND)

## 2020-07-22 LAB — CULTURE, BLOOD (SINGLE)
Culture: NO GROWTH
Special Requests: ADEQUATE

## 2020-08-10 ENCOUNTER — Other Ambulatory Visit: Payer: Self-pay | Admitting: Podiatry

## 2020-08-10 ENCOUNTER — Ambulatory Visit (INDEPENDENT_AMBULATORY_CARE_PROVIDER_SITE_OTHER): Payer: 59 | Admitting: Podiatry

## 2020-08-10 ENCOUNTER — Ambulatory Visit (INDEPENDENT_AMBULATORY_CARE_PROVIDER_SITE_OTHER): Payer: 59

## 2020-08-10 ENCOUNTER — Other Ambulatory Visit: Payer: Self-pay

## 2020-08-10 DIAGNOSIS — S90852A Superficial foreign body, left foot, initial encounter: Secondary | ICD-10-CM

## 2020-08-10 DIAGNOSIS — B070 Plantar wart: Secondary | ICD-10-CM | POA: Diagnosis not present

## 2020-08-10 DIAGNOSIS — M722 Plantar fascial fibromatosis: Secondary | ICD-10-CM

## 2020-08-10 NOTE — Progress Notes (Signed)
Subjective:  Patient ID: Alexis Mathews, female    DOB: 11/23/1986,  MRN: [id number] HPI Chief Complaint  Patient presents with  . Foot Pain    Left plantar heel painful lesion, possible foreign object. Pt states began in March 2021 and it is painful to walk on. No known injury.    34 y.o. female presents with the above complaint.   ROS: Denies fever chills nausea vomiting muscle aches pains calf pain back pain chest pain shortness of breath.  Past Medical History:  Diagnosis Date  . Abscess   . Diabetes mellitus without complication ([place])   . Hypertension    Past Surgical History:  Procedure Laterality Date  . INCISION AND DRAINAGE PERIRECTAL ABSCESS N/A 07/17/2020   Procedure: IRRIGATION AND DEBRIDEMENT GROIN ABSCESS;  Surgeon: Greer Pickerel, MD;  Location: [place];  Service: General;  Laterality: N/A;  . IRRIGATION AND DEBRIDEMENT ABSCESS N/A 07/17/2020   Procedure: IRRIGATION AND DEBRIDEMENT NECK ABSCESS;  Surgeon: Greer Pickerel, MD;  Location: [facility];  Service: General;  Laterality: N/A;    Current Outpatient Medications:  .  blood glucose meter kit and supplies KIT, Dispense based on patient and insurance preference. Use up to four times daily as directed. (FOR ICD-9 250.00, 250.01)., Disp: 1 each, Rfl: 0 .  blood glucose meter kit and supplies, Dispense based on patient and insurance preference. Use up to four times daily as directed. (FOR ICD-10 E10.9, E11.9)., Disp: 1 each, Rfl: 0 .  metFORMIN (GLUCOPHAGE) 500 MG tablet, Take 1 tablet (500 mg total) by mouth daily with breakfast., Disp: 30 tablet, Rfl: 0 .  oxyCODONE (ROXICODONE) 5 MG immediate release tablet, Take 1 tablet (5 mg total) by mouth every 6 (six) hours as needed. (Patient not taking: Reported on 08/10/2020), Disp: 15 tablet, Rfl: 0 .  sulfamethoxazole-trimethoprim (BACTRIM DS) 800-160 MG tablet, Take 1 tablet by mouth 2 (two) times daily. (Patient not taking: Reported on 08/10/2020), Disp: 20 tablet, Rfl: 0  No Known  Allergies Review of Systems Objective:  There were no vitals filed for this visit.  General: Well developed, nourished, in no acute distress, alert and oriented x3   Dermatological: Skin is warm, dry and supple bilateral. Nails x 10 are well maintained; remaining integument appears unremarkable at this time. There are no open sores, no preulcerative lesions, no rash or signs of infection present.  There is a small ovoid lesion measuring approximately a centimeter in diameter to the plantar medial aspect of the left heel.  Thrombosed capillaries are visible skin lines circumvent the lesion this appears to be verrucoid in nature.  Radiographs were taken because she thought it was a foreign body but there is no foreign body.  Vascular: Dorsalis Pedis artery and Posterior Tibial artery pedal pulses are 2/4 bilateral with immedate capillary fill time. Pedal hair growth present. No varicosities and no lower extremity edema present bilateral.   Neruologic: Grossly intact via light touch bilateral. Vibratory intact via tuning fork bilateral. Protective threshold with Semmes Wienstein monofilament intact to all pedal sites bilateral. Patellar and Achilles deep tendon reflexes 2+ bilateral. No Babinski or clonus noted bilateral.   Musculoskeletal: No gross boney pedal deformities bilateral. No pain, crepitus, or limitation noted with foot and ankle range of motion bilateral. Muscular strength 5/5 in all groups tested bilateral.  Gait: Unassisted, Nonantalgic.    Radiographs:  Radiographs taken today demonstrate an osseously mature individual no acute findings marker plantar heel does not demonstrate any foreign body.  Assessment & Plan:  Assessment: Verruca vulgaris plantar aspect left foot.    Plan: After local anesthetic was administered sublesional he and the foot was prepped and draped in normal sterile fashion.  The lesion was surgically curettaged and placed on the back table to be sent for  pathologic evaluation.  I went ahead and placed phenol and rinsed with isopropyl alcohol Betadine Telfa pad dressed a compressive dressing was applied she was given both oral written home-going structure for care and soaking of the foot.  We will follow-up with her in 2 weeks.  Should her pathology arise abnormal we will notify her immediately.     Alexis Mathews T. [place], [place]

## 2020-08-10 NOTE — Patient Instructions (Signed)

## 2020-08-10 NOTE — Addendum Note (Signed)
Addended by: Kristian Covey on: 08/10/2020 03:27 PM   Modules accepted: Orders

## 2020-08-24 ENCOUNTER — Ambulatory Visit (INDEPENDENT_AMBULATORY_CARE_PROVIDER_SITE_OTHER): Payer: 59 | Admitting: Podiatry

## 2020-08-24 ENCOUNTER — Encounter: Payer: Self-pay | Admitting: Podiatry

## 2020-08-24 ENCOUNTER — Other Ambulatory Visit: Payer: Self-pay

## 2020-08-24 DIAGNOSIS — B070 Plantar wart: Secondary | ICD-10-CM

## 2020-08-24 NOTE — Progress Notes (Signed)
She presents today for follow-up of her curettage plantar wart left foot states that is feeling much better she refers to her left heel.  Objective: Vital signs are stable she is alert oriented x3 there is no erythema edema cellulitis drainage or odor the whole remaining is very superficial less than a millimeter deep and it is 86mm at its greatest width..  There is no purulence no malodor no signs of infection.  Pathology result did demonstrate wart.  Assessment: Verruca plantaris excision healing well.  Plan: I encouraged her to continue to soak daily and apply a small amount of Neosporin at least once a day cover during the day but leave open at bedtime.  This should going to heal over the next week or so.  If it does not becomes more painful she is notify us immediately.

## 2020-10-26 ENCOUNTER — Inpatient Hospital Stay (HOSPITAL_COMMUNITY): Payer: 59 | Admitting: Anesthesiology

## 2020-10-26 ENCOUNTER — Encounter (HOSPITAL_COMMUNITY): Admission: AD | Disposition: A | Discharge status: Home / Self Care | Payer: Self-pay | Source: Ambulatory Visit

## 2020-10-26 ENCOUNTER — Other Ambulatory Visit: Payer: Self-pay

## 2020-10-26 ENCOUNTER — Other Ambulatory Visit: Payer: Self-pay | Admitting: General Surgery

## 2020-10-26 ENCOUNTER — Observation Stay: Admission: AD | Admit: 2020-10-26 | Payer: 59 | Source: Ambulatory Visit | Admitting: Surgery

## 2020-10-26 ENCOUNTER — Observation Stay (HOSPITAL_COMMUNITY)
Admission: AD | Admit: 2020-10-26 | Discharge: 2020-10-27 | Disposition: A | Discharge status: Home / Self Care | Payer: 59 | Source: Ambulatory Visit | Attending: General Surgery | Admitting: General Surgery

## 2020-10-26 ENCOUNTER — Encounter (HOSPITAL_COMMUNITY): Payer: Self-pay | Admitting: Surgery

## 2020-10-26 DIAGNOSIS — L0211 Cutaneous abscess of neck: Secondary | ICD-10-CM | POA: Diagnosis not present

## 2020-10-26 DIAGNOSIS — L732 Hidradenitis suppurativa: Secondary | ICD-10-CM | POA: Diagnosis not present

## 2020-10-26 DIAGNOSIS — L03221 Cellulitis of neck: Secondary | ICD-10-CM | POA: Diagnosis not present

## 2020-10-26 DIAGNOSIS — Z20822 Contact with and (suspected) exposure to covid-19: Secondary | ICD-10-CM | POA: Diagnosis not present

## 2020-10-26 DIAGNOSIS — I10 Essential (primary) hypertension: Secondary | ICD-10-CM | POA: Insufficient documentation

## 2020-10-26 DIAGNOSIS — E119 Type 2 diabetes mellitus without complications: Secondary | ICD-10-CM | POA: Diagnosis not present

## 2020-10-26 HISTORY — DX: Hidradenitis suppurativa: L73.2

## 2020-10-26 HISTORY — DX: Anxiety disorder, unspecified: F41.9

## 2020-10-26 HISTORY — PX: CYST REMOVAL NECK: SHX6281

## 2020-10-26 LAB — GLUCOSE, CAPILLARY
Glucose-Capillary: 164 mg/dL — ABNORMAL HIGH (ref 70–99)
Glucose-Capillary: 113 mg/dL — ABNORMAL HIGH (ref 70–99)
Glucose-Capillary: 135 mg/dL — ABNORMAL HIGH (ref 70–99)
Glucose-Capillary: 168 mg/dL — ABNORMAL HIGH (ref 70–99)

## 2020-10-26 LAB — CBC WITH DIFFERENTIAL/PLATELET
Abs Immature Granulocytes: 0.03 10*3/uL (ref 0.00–0.07)
Basophils Absolute: 0.1 10*3/uL (ref 0.0–0.1)
Basophils Relative: 1 %
Eosinophils Absolute: 0.1 10*3/uL (ref 0.0–0.5)
Eosinophils Relative: 1 %
HCT: 44.6 % (ref 36.0–46.0)
Hemoglobin: 14.4 g/dL (ref 12.0–15.0)
Immature Granulocytes: 0 %
Lymphocytes Relative: 28 %
Lymphs Abs: 2.5 10*3/uL (ref 0.7–4.0)
MCH: 28.3 pg (ref 26.0–34.0)
MCHC: 32.3 g/dL (ref 30.0–36.0)
MCV: 87.8 fL (ref 80.0–100.0)
Monocytes Absolute: 0.8 10*3/uL (ref 0.1–1.0)
Monocytes Relative: 9 %
Neutro Abs: 5.5 10*3/uL (ref 1.7–7.7)
Neutrophils Relative %: 61 %
Platelets: 371 10*3/uL (ref 150–400)
RBC: 5.08 MIL/uL (ref 3.87–5.11)
RDW: 12.8 % (ref 11.5–15.5)
WBC: 8.9 10*3/uL (ref 4.0–10.5)
nRBC: 0 % (ref 0.0–0.2)

## 2020-10-26 LAB — BASIC METABOLIC PANEL
Anion gap: 10 (ref 5–15)
BUN: 7 mg/dL (ref 6–20)
CO2: 23 mmol/L (ref 22–32)
Calcium: 8.8 mg/dL — ABNORMAL LOW (ref 8.9–10.3)
Chloride: 102 mmol/L (ref 98–111)
Creatinine, Ser: 0.61 mg/dL (ref 0.44–1.00)
GFR, Estimated: >60 mL/min (ref >60)
Glucose, Bld: 129 mg/dL — ABNORMAL HIGH (ref 70–99)
Potassium: 3.6 mmol/L (ref 3.5–5.1)
Sodium: 135 mmol/L (ref 135–145)

## 2020-10-26 LAB — POCT PREGNANCY, URINE: Preg Test, Ur: NEGATIVE

## 2020-10-26 LAB — SARS CORONAVIRUS 2 BY RT PCR (HOSPITAL ORDER, PERFORMED IN ~~LOC~~ HOSPITAL LAB): SARS Coronavirus 2: NEGATIVE

## 2020-10-26 SURGERY — EXCISION, CYST, NECK
Anesthesia: General | Site: Neck

## 2020-10-26 MED ORDER — OXYCODONE HCL 5 MG PO TABS: 5.0000 mg | ORAL_TABLET | Freq: Once | ORAL | Status: DC | PRN

## 2020-10-26 MED ORDER — ENOXAPARIN SODIUM 40 MG/0.4ML ~~LOC~~ SOLN
40.0000 mg | SUBCUTANEOUS | Status: DC
  Administered 2020-10-27: 40 mg via SUBCUTANEOUS
  Filled 2020-10-26: qty 0.4

## 2020-10-26 MED ORDER — FENTANYL CITRATE (PF) 100 MCG/2ML IJ SOLN
25.0000 ug | INTRAMUSCULAR | Status: DC | PRN
  Administered 2020-10-26: 50 ug via INTRAVENOUS

## 2020-10-26 MED ORDER — ONDANSETRON HCL 4 MG/2ML IJ SOLN: 4.0000 mg | Freq: Four times a day (QID) | INTRAMUSCULAR | Status: DC | PRN

## 2020-10-26 MED ORDER — GABAPENTIN 300 MG PO CAPS
300.0000 mg | ORAL_CAPSULE | Freq: Two times a day (BID) | ORAL | Status: DC
  Administered 2020-10-26 – 2020-10-27 (×2): 300 mg via ORAL
  Filled 2020-10-26 (×2): qty 1

## 2020-10-26 MED ORDER — FENTANYL CITRATE (PF) 250 MCG/5ML IJ SOLN
INTRAMUSCULAR | Status: DC | PRN
  Administered 2020-10-26: 50 ug via INTRAVENOUS

## 2020-10-26 MED ORDER — BUPIVACAINE-EPINEPHRINE 0.25% -1:200000 IJ SOLN
INTRAMUSCULAR | Status: DC | PRN
  Administered 2020-10-26: 30 mL

## 2020-10-26 MED ORDER — ONDANSETRON HCL 4 MG/2ML IJ SOLN: 4.0000 mg | Freq: Once | INTRAMUSCULAR | Status: DC | PRN

## 2020-10-26 MED ORDER — MEPERIDINE HCL 25 MG/ML IJ SOLN: 6.2500 mg | INTRAMUSCULAR | Status: DC | PRN

## 2020-10-26 MED ORDER — DIPHENHYDRAMINE HCL 50 MG/ML IJ SOLN: 25.0000 mg | Freq: Four times a day (QID) | INTRAMUSCULAR | Status: DC | PRN

## 2020-10-26 MED ORDER — ONDANSETRON HCL 4 MG/2ML IJ SOLN
INTRAMUSCULAR | Status: AC
  Filled 2020-10-26: qty 2

## 2020-10-26 MED ORDER — MIDAZOLAM HCL 5 MG/5ML IJ SOLN
INTRAMUSCULAR | Status: DC | PRN
  Administered 2020-10-26: 2 mg via INTRAVENOUS

## 2020-10-26 MED ORDER — ONDANSETRON 4 MG PO TBDP: 4.0000 mg | ORAL_TABLET | Freq: Four times a day (QID) | ORAL | Status: DC | PRN

## 2020-10-26 MED ORDER — VANCOMYCIN HCL 1250 MG/250ML IV SOLN
1250.0000 mg | Freq: Three times a day (TID) | INTRAVENOUS | Status: DC
  Administered 2020-10-27: 1250 mg via INTRAVENOUS
  Filled 2020-10-26 (×5): qty 250

## 2020-10-26 MED ORDER — DIPHENHYDRAMINE HCL 25 MG PO CAPS: 25.0000 mg | ORAL_CAPSULE | Freq: Four times a day (QID) | ORAL | Status: DC | PRN

## 2020-10-26 MED ORDER — HYDROMORPHONE HCL 1 MG/ML IJ SOLN: 1.0000 mg | INTRAMUSCULAR | Status: DC | PRN

## 2020-10-26 MED ORDER — ACETAMINOPHEN 160 MG/5ML PO SOLN: 325.0000 mg | ORAL | Status: DC | PRN

## 2020-10-26 MED ORDER — LACTATED RINGERS IV SOLN: INTRAVENOUS | Status: DC

## 2020-10-26 MED ORDER — LIDOCAINE 2% (20 MG/ML) 5 ML SYRINGE
INTRAMUSCULAR | Status: AC
  Filled 2020-10-26: qty 5

## 2020-10-26 MED ORDER — DEXAMETHASONE SODIUM PHOSPHATE 10 MG/ML IJ SOLN
INTRAMUSCULAR | Status: DC | PRN
  Administered 2020-10-26: 4 mg via INTRAVENOUS

## 2020-10-26 MED ORDER — CELECOXIB 200 MG PO CAPS
200.0000 mg | ORAL_CAPSULE | Freq: Two times a day (BID) | ORAL | Status: DC
  Administered 2020-10-27 (×2): 200 mg via ORAL
  Filled 2020-10-26 (×4): qty 1

## 2020-10-26 MED ORDER — FENTANYL CITRATE (PF) 250 MCG/5ML IJ SOLN
INTRAMUSCULAR | Status: AC
  Filled 2020-10-26: qty 5

## 2020-10-26 MED ORDER — ORAL CARE MOUTH RINSE: 15.0000 mL | Freq: Once | OROMUCOSAL | Status: DC

## 2020-10-26 MED ORDER — EPINEPHRINE PF 1 MG/ML IJ SOLN
INTRAMUSCULAR | Status: AC
  Filled 2020-10-26: qty 1

## 2020-10-26 MED ORDER — CHLORHEXIDINE GLUCONATE 0.12 % MT SOLN: 15.0000 mL | Freq: Once | OROMUCOSAL | Status: DC

## 2020-10-26 MED ORDER — MIDAZOLAM HCL 2 MG/2ML IJ SOLN
INTRAMUSCULAR | Status: AC
  Filled 2020-10-26: qty 2

## 2020-10-26 MED ORDER — SUCCINYLCHOLINE CHLORIDE 200 MG/10ML IV SOSY
PREFILLED_SYRINGE | INTRAVENOUS | Status: DC | PRN
  Administered 2020-10-26: 200 mg via INTRAVENOUS

## 2020-10-26 MED ORDER — OXYCODONE HCL 5 MG/5ML PO SOLN: 5.0000 mg | Freq: Once | ORAL | Status: DC | PRN

## 2020-10-26 MED ORDER — PHENYLEPHRINE 40 MCG/ML (10ML) SYRINGE FOR IV PUSH (FOR BLOOD PRESSURE SUPPORT)
PREFILLED_SYRINGE | INTRAVENOUS | Status: DC | PRN
  Administered 2020-10-26: 120 ug via INTRAVENOUS

## 2020-10-26 MED ORDER — VANCOMYCIN HCL 10 G IV SOLR
2500.0000 mg | Freq: Once | INTRAVENOUS | Status: AC
  Administered 2020-10-26: 2500 mg via INTRAVENOUS
  Filled 2020-10-26: qty 2500

## 2020-10-26 MED ORDER — METHOCARBAMOL 500 MG PO TABS: 500.0000 mg | ORAL_TABLET | Freq: Four times a day (QID) | ORAL | Status: DC | PRN

## 2020-10-26 MED ORDER — ROCURONIUM BROMIDE 10 MG/ML (PF) SYRINGE
PREFILLED_SYRINGE | INTRAVENOUS | Status: AC
  Filled 2020-10-26: qty 10

## 2020-10-26 MED ORDER — TRAMADOL HCL 50 MG PO TABS: 50.0000 mg | ORAL_TABLET | Freq: Four times a day (QID) | ORAL | Status: DC | PRN

## 2020-10-26 MED ORDER — BUPIVACAINE HCL (PF) 0.25 % IJ SOLN
INTRAMUSCULAR | Status: AC
  Filled 2020-10-26: qty 30

## 2020-10-26 MED ORDER — 0.9 % SODIUM CHLORIDE (POUR BTL) OPTIME
TOPICAL | Status: DC | PRN
  Administered 2020-10-26: 1000 mL

## 2020-10-26 MED ORDER — OXYCODONE HCL 5 MG PO TABS
5.0000 mg | ORAL_TABLET | ORAL | Status: DC | PRN
  Administered 2020-10-26: 10 mg via ORAL
  Filled 2020-10-26: qty 2

## 2020-10-26 MED ORDER — SODIUM CHLORIDE 0.9 % IV SOLN: INTRAVENOUS | Status: DC

## 2020-10-26 MED ORDER — DEXAMETHASONE SODIUM PHOSPHATE 10 MG/ML IJ SOLN
INTRAMUSCULAR | Status: AC
  Filled 2020-10-26: qty 1

## 2020-10-26 MED ORDER — PROPOFOL 10 MG/ML IV BOLUS
INTRAVENOUS | Status: DC | PRN
  Administered 2020-10-26: 200 mg via INTRAVENOUS

## 2020-10-26 MED ORDER — LIDOCAINE 2% (20 MG/ML) 5 ML SYRINGE
INTRAMUSCULAR | Status: DC | PRN
  Administered 2020-10-26: 20 mg via INTRAVENOUS
  Administered 2020-10-26: 80 mg via INTRAVENOUS

## 2020-10-26 MED ORDER — ONDANSETRON HCL 4 MG/2ML IJ SOLN
INTRAMUSCULAR | Status: DC | PRN
  Administered 2020-10-26: 4 mg via INTRAVENOUS

## 2020-10-26 MED ORDER — DEXTROSE 5 % IV SOLN
INTRAVENOUS | Status: DC | PRN
  Administered 2020-10-26: 3 g via INTRAVENOUS

## 2020-10-26 MED ORDER — ACETAMINOPHEN 325 MG PO TABS: 325.0000 mg | ORAL_TABLET | ORAL | Status: DC | PRN

## 2020-10-26 MED ORDER — FENTANYL CITRATE (PF) 100 MCG/2ML IJ SOLN
INTRAMUSCULAR | Status: AC
  Filled 2020-10-26: qty 2

## 2020-10-26 SURGICAL SUPPLY — 34 items
CANISTER SUCT 3000ML PPV (MISCELLANEOUS) ×3 IMPLANT
COVER SURGICAL LIGHT HANDLE (MISCELLANEOUS) ×3 IMPLANT
COVER WAND RF STERILE (DRAPES) ×3 IMPLANT
DECANTER SPIKE VIAL GLASS SM (MISCELLANEOUS) ×3 IMPLANT
DERMABOND ADVANCED (GAUZE/BANDAGES/DRESSINGS) ×2
DERMABOND ADVANCED .7 DNX12 (GAUZE/BANDAGES/DRESSINGS) ×1 IMPLANT
DRAPE LAPAROSCOPIC ABDOMINAL (DRAPES) IMPLANT
DRAPE LAPAROTOMY 100X72 PEDS (DRAPES) ×3 IMPLANT
DRSG PAD ABDOMINAL 8X10 ST (GAUZE/BANDAGES/DRESSINGS) ×3 IMPLANT
ELECT REM PT RETURN 9FT ADLT (ELECTROSURGICAL) ×3
ELECTRODE REM PT RTRN 9FT ADLT (ELECTROSURGICAL) ×1 IMPLANT
GAUZE SPONGE 4X4 12PLY STRL (GAUZE/BANDAGES/DRESSINGS) ×3 IMPLANT
GLOVE SURG SIGNA 7.5 PF LTX (GLOVE) ×3 IMPLANT
GOWN STRL REUS W/ TWL LRG LVL3 (GOWN DISPOSABLE) ×1 IMPLANT
GOWN STRL REUS W/ TWL XL LVL3 (GOWN DISPOSABLE) ×1 IMPLANT
GOWN STRL REUS W/TWL LRG LVL3 (GOWN DISPOSABLE) ×2
GOWN STRL REUS W/TWL XL LVL3 (GOWN DISPOSABLE) ×2
KIT BASIN OR (CUSTOM PROCEDURE TRAY) ×3 IMPLANT
KIT TURNOVER KIT B (KITS) ×3 IMPLANT
NEEDLE HYPO 25GX1X1/2 BEV (NEEDLE) ×3 IMPLANT
NS IRRIG 1000ML POUR BTL (IV SOLUTION) ×3 IMPLANT
PACK GENERAL/GYN (CUSTOM PROCEDURE TRAY) ×3 IMPLANT
PAD ARMBOARD 7.5X6 YLW CONV (MISCELLANEOUS) ×3 IMPLANT
PENCIL SMOKE EVACUATOR (MISCELLANEOUS) ×3 IMPLANT
SPECIMEN JAR SMALL (MISCELLANEOUS) IMPLANT
SUT ETHILON 2 0 FS 18 (SUTURE) ×18 IMPLANT
SUT ETHILON 2 0 PSLX (SUTURE) ×6 IMPLANT
SUT MNCRL AB 4-0 PS2 18 (SUTURE) ×3 IMPLANT
SUT VIC AB 3-0 SH 27 (SUTURE) ×2
SUT VIC AB 3-0 SH 27XBRD (SUTURE) ×1 IMPLANT
SYR BULB IRRIG 60ML STRL (SYRINGE) ×3 IMPLANT
SYR CONTROL 10ML LL (SYRINGE) ×3 IMPLANT
TOWEL GREEN STERILE (TOWEL DISPOSABLE) ×3 IMPLANT
TOWEL GREEN STERILE FF (TOWEL DISPOSABLE) ×3 IMPLANT

## 2020-10-26 NOTE — Progress Notes (Addendum)
Pharmacy Antibiotic Note  Alexis Mathews is a 34 y.o. female admitted on 10/26/2020 with recurrent posterior neck abscess.  Pharmacy has been consulted for vancomycin dosing for cellulitis. Patient underwent I&D of neck abscess on 07/17/20 and returned on 09/22/20 to clinic with foul-smelling drainage and found to have an abscess in which I&D was performed. Patient presented to follow up clinic on 10/26/20 and reported pain in the area. Patient was admitted for I&D in operating room.   Scr of 0.61 appears to be at baseline. Normalized CrCl ~ 172. WBC wnl. Afebrile.   Plan: Administer vancomycin 2500mg  loading dose Start vancomycin 1250mg  q8h Monitor renal function, clinical progression, and length of therapy  Height: 5\' 3"  (160 cm) Weight: 123.8 kg (273 lb) IBW/kg (Calculated) : 52.4  Temp (24hrs), Avg:98.1 F (36.7 C), Min:97.8 F (36.6 C), Max:98.7 F (37.1 C)  Recent Labs  Lab 10/26/20 1348  WBC 8.9  CREATININE 0.61    Estimated Creatinine Clearance: 125.5 mL/min (by C-G formula based on SCr of 0.61 mg/dL).    No Known Allergies  Antimicrobials this admission: Vancomycin 11/11 >> Cefazolin x1 11/11  Dose adjustments this admission: N/a  Microbiology results:   Thank you for allowing pharmacy to be a part of this patient's care.  13/11/21, PharmD Clinical Pharmacist  10/26/2020 8:24 PM

## 2020-10-26 NOTE — Anesthesia Procedure Notes (Signed)
Procedure Name: Intubation Performed by: Ezekiel Ina, CRNA Pre-anesthesia Checklist: Patient identified, Emergency Drugs available, Suction available and Patient being monitored Patient Re-evaluated:Patient Re-evaluated prior to induction Oxygen Delivery Method: Circle System Utilized Preoxygenation: Pre-oxygenation with 100% oxygen Induction Type: IV induction Ventilation: Mask ventilation without difficulty and Oral airway inserted - appropriate to patient size Laryngoscope Size: Glidescope and 4 Grade View: Grade I Tube type: Oral Tube size: 7.0 mm Number of attempts: 1 Airway Equipment and Method: Stylet and Oral airway Placement Confirmation: ETT inserted through vocal cords under direct vision,  positive ETCO2 and breath sounds checked- equal and bilateral Secured at: 23 cm Tube secured with: Tape Dental Injury: Teeth and Oropharynx as per pre-operative assessment  Comments: G2b view with Mil2. G1 view with glidescope 4 - successful intubation

## 2020-10-26 NOTE — H&P (Signed)
Alexis Mathews Appointment: 10/26/2020 8:30 AM Location: [facility] Patient #: [id number] DOB: 09/29/1986 Single / Language: [place] / Race: Refused to Report/Unreported Female  History of Present Illness Alexis Areola M. Christyan Reger MD; 10/26/2020 9:56 AM) The patient is a 34 year old female who presents with a subcutaneous abscess. She is status post incision and drainage of right posterior neck abscess and left perineal abscess/hidradenitis on 07/17/20 by Dr. Andrey Campanile. She previously had a bedside I&D of posterior neck abscess in October 2020 by Dr. Gerrit Friends in urgent office. It recurred requiring I&D in the OR. A Penrose drain was left in place on the posterior neck. This area healed well without signs of infection. However, she returned to urgent office on 09/22/20 with drainage from the neck incision. Dr. Daphine Deutscher had to make several incisions to locate the abscess and entered a deep pocket evacuating a copious amount of foul-smelling fluid. She is here for a recheck & discuss definitiive mgmt. the area started to hurt her a few days ago. She was actually in a come in yesterday if she didn't have the appointment today. She reports tenderness at the base of her posterior neck. No fever. She does not have her primary care physician. She states her blood pressure generally runs elevated. She had an English muffin around 8 AM. No nausea or vomiting.    Problem List/Past Medical Alexis Areola M. Andrey Campanile, MD; 10/26/2020 10:02 AM) WOUND CHECK, ABSCESS (Z51.89) CELLULITIS AND ABSCESS OF NECK (L03.221, L02.11) SEVERE OBESITY (E66.01)  Past Surgical History Alexis Areola M. Andrey Campanile, MD; 10/26/2020 9:58 AM) No pertinent past surgical history  Diagnostic Studies History Alexis Areola M. Andrey Campanile, MD; 10/26/2020 9:58 AM) Colonoscopy never Pap Smear never  Allergies Rosezella Florida, RN; 10/26/2020 8:41 AM) No Known Allergies [09/30/2019]: No Known Drug Allergies [09/30/2019]: Allergies Reconciled  Medication  History Rosezella Florida, RN; 10/26/2020 8:41 AM) No Current Medications Medications Reconciled  Social History Alexis Areola M. Andrey Campanile, MD; 10/26/2020 10:02 AM) Caffeine use Coffee. No alcohol use No caffeine use No drug use Tobacco use Never smoker.  Family History Alexis Areola M. Andrey Campanile, MD; 10/26/2020 9:58 AM) First Degree Relatives No pertinent family history  Pregnancy / Birth History Alexis Mathews. Andrey Campanile, MD; 10/26/2020 9:58 AM) Age at menarche 12 years, 13 years. Gravida 0 Para 0 Regular periods  Other Problems Alexis Areola M. Andrey Campanile, MD; 10/26/2020 10:02 AM) Anxiety Disorder INFECTED EPIDERMOID CYST (L72.0) Take the Doxycycline as directed. May shower and shampoo and pat dry and keep incisions covered so that they may drain Diabetes Mellitus High blood pressure     Review of Systems Alexis Areola M. Monquie Fulgham MD; 10/26/2020 9:58 AM) General Not Present- Appetite Loss, Chills, Fatigue, Fever, Night Sweats, Weight Gain and Weight Loss. Skin Not Present- Change in Wart/Mole, Dryness, Hives, Jaundice, New Lesions, Non-Healing Wounds, Rash and Ulcer. HEENT Not Present- Earache, Hearing Loss, Hoarseness, Nose Bleed, Oral Ulcers, Ringing in the Ears, Seasonal Allergies, Sinus Pain, Sore Throat, Visual Disturbances, Wears glasses/contact lenses and Yellow Eyes. Respiratory Not Present- Bloody sputum, Chronic Cough, Difficulty Breathing, Snoring and Wheezing. Breast Not Present- Breast Mass, Breast Pain, Nipple Discharge and Skin Changes. Cardiovascular Not Present- Chest Pain, Difficulty Breathing Lying Down, Leg Cramps, Palpitations, Rapid Heart Rate, Shortness of Breath and Swelling of Extremities. Gastrointestinal Not Present- Abdominal Pain, Bloating, Bloody Stool, Change in Bowel Habits, Chronic diarrhea, Constipation, Difficulty Swallowing, Excessive gas, Gets full quickly at meals, Hemorrhoids, Indigestion, Nausea, Rectal Pain and Vomiting. Female Genitourinary Not Present- Frequency,  Nocturia, Painful Urination, Pelvic Pain and Urgency. Neurological Not Present- Decreased  Memory, Fainting, Headaches, Numbness, Seizures, Tingling, Tremor, Trouble walking and Weakness. Psychiatric Not Present- Anxiety, Bipolar, Change in Sleep Pattern, Depression, Fearful and Frequent crying. Endocrine Not Present- Cold Intolerance, Excessive Hunger, Hair Changes, Heat Intolerance, Hot flashes and New Diabetes. Hematology Not Present- Blood Thinners, Easy Bruising, Excessive bleeding, Gland problems, HIV and Persistent Infections. All other systems negative  Vitals (Diane Herrin RN; 10/26/2020 8:42 AM) 10/26/2020 8:42 AM Weight: 273 lb Height: 63in Body Surface Area: 2.21 m Body Mass Index: 48.36 kg/m  Temp.: 97.10F  Mathews: 89 (Regular)  P.OX: 98% (Room air) BP: 190/98(Sitting, Left Arm, Standard)        Physical Exam Alexis Areola M. Lucynda Rosano MD; 10/26/2020 9:57 AM)  The physical exam findings are as follows: Note:Severe obesity  General Mental Status-Alert. General Appearance-Consistent with stated age. Hydration-Well hydrated. Voice-Normal.  Head and Neck Head-normocephalic, atraumatic with no lesions or palpable masses. Trachea-midline. Thyroid Gland Characteristics - normal size and consistency. Note: Posterior neck/upper back- reveals several old well-healed scars. There is a large area of firmness of probably about 6 inches x 4in. There is some cellulitis around the old incision. The area is tender. I can actually expressed some purulent drainage from the midportion of the base of her neck.  Eye Eyeball - Bilateral-Extraocular movements intact. Sclera/Conjunctiva - Bilateral-No scleral icterus.  Chest and Lung Exam Chest and lung exam reveals -quiet, even and easy respiratory effort with no use of accessory muscles and on auscultation, normal breath sounds, no adventitious sounds and normal vocal resonance. Inspection Chest Wall - Normal.  Back - normal.  Breast - Did not examine.  Cardiovascular Cardiovascular examination reveals -normal heart sounds, regular rate and rhythm with no murmurs and normal pedal pulses bilaterally.  Abdomen Inspection Inspection of the abdomen reveals - No Hernias. Skin - Scar - no surgical scars. Palpation/Percussion Palpation and Percussion of the abdomen reveal - Soft, Non Tender, No Rebound tenderness, No Rigidity (guarding) and No hepatosplenomegaly. Auscultation Auscultation of the abdomen reveals - Bowel sounds normal.  Peripheral Vascular Upper Extremity Palpation - Pulses bilaterally normal.  Neurologic Neurologic evaluation reveals -alert and oriented x 3 with no impairment of recent or remote memory. Mental Status-Normal.  Neuropsychiatric The patient's mood and affect are described as -normal. Judgment and Insight-insight is appropriate concerning matters relevant to self.  Musculoskeletal Normal Exam - Left-Upper Extremity Strength Normal and Lower Extremity Strength Normal. Normal Exam - Right-Upper Extremity Strength Normal and Lower Extremity Strength Normal.  Lymphatic Head & Neck  General Head & Neck Lymphatics: Bilateral - Description - Normal. Axillary - Did not examine. Femoral & Inguinal - Did not examine.    Assessment & Plan Alexis Areola M. 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 I am not sure if there is an underlying sebaceous cyst or if this is just some severe form of hydradenitis. When I did her initial incision and drainage in the operating room back in August the area was so friable that I could not delineate a cyst wall per se. But nonetheless she has a recurrent infection and recurrent abscess. I think she needs a large area of the excisional debridement. We discussed that this would have to heal from the inside out and could take  some time to heal. May need a wound VAC. Since I can actively express purulence and the abscess cavity is quite large I don't think another minor incision and drainage in our clinic  would be appropriate I think she needs to the operating room for this. I discussed the case with our on-call surgeon at the hospital. The patient will be sent directly to short stay because of bed staffing issues. Plan will be to go the operating room later today with Dr. Magnus Ivan. She did have an English muffin around 8 AM. Patient was advised to go to admitting desk [facility]  This patient encounter took 35 minutes today to perform the following: take history, perform exam, review outside records, interpret imaging, counsel the patient on their diagnosis and document encounter, coordinate care findings & plan in the EHR   DIABETES MELLITUS TYPE 2 IN OBESE (E11.69) Impression: last a1c was 6.8   UNCONTROLLED HYPERTENSION (I10) Impression: Her blood pressure is in the 190s here today. She states that she does not have a PCP and is not on any blood pressure medications. we discussed the dangers of persistently uncontrolled hypertension. She currently does not have any symptoms such as headache or blurry vision. We will ask the hospitalist to get involved to help manage her uncontrolled blood pressure and to establish her with a primary care physician on discharge   SEVERE OBESITY (E66.01)  Alexis Mathews. Andrey Campanile, MD, FACS General, Bariatric, & Minimally Invasive Surgery [facility], [place]

## 2020-10-26 NOTE — Transfer of Care (Signed)
Immediate Anesthesia Transfer of Care Note  Patient: Alexis Mathews  Procedure(s) Performed: EXCISIONAL DEBRIDEMENT POSTERIOR NECK ABSCESS (N/A Neck)  Patient Location: PACU  Anesthesia Type:General  Level of Consciousness: drowsy  Airway & Oxygen Therapy: Patient Spontanous Breathing  Post-op Assessment: Report given to RN and Post -op Vital signs reviewed and stable  Post vital signs: Reviewed and stable  Last Vitals:  Vitals Value Taken Time  BP 156/106 10/26/20 1745  Temp    Pulse 88 10/26/20 1746  Resp 17 10/26/20 1746  SpO2 100 % 10/26/20 1746  Vitals shown include unvalidated device data.  Last Pain:  Vitals:   10/26/20 1044  TempSrc: Oral  PainSc: 0-No pain         Complications: No complications documented.

## 2020-10-26 NOTE — Op Note (Addendum)
   Alexis Mathews 10/26/2020   Pre-op Diagnosis: Recurrent posterior neck abscess     Post-op Diagnosis: Posterior neck hidradenitis  Procedure(s): Wide excision of hidradenitis posterior neck (15 cm x 4 cm x 1 cm  skin and subcutaneous tissue)  Surgeon(s): Abigail Miyamoto, MD  Anesthesia: General  Staff:  Circulator: Idelle Jo, RN Relief Circulator: Jeronimo Greaves, RN Relief Scrub: Romps, Jacqulyn Ducking Scrub Person: Coralee North T Circulator Assistant: Jana Half, RN Float Surgical Tech: Shanda Howells E  Estimated Blood Loss: Minimal               Findings: The patient had multiple areas of granulation tissue in the depths of the subcutaneous tissue of the posterior neck which appeared consistent with hidradenitis. An area of 15 cm x 4 cm x 1 cm in depth skin and subcutaneous tissue was excised  Procedure: The patient was brought to the operating room and identifies correct patient.  She is placed upon the operating table general anesthesia was induced.  She was next placed in the left lateral decubitus position.  Her posterior neck was prepped and draped in usual sterile fashion.  There are multiple scars on the neck as well as purulence coming from the center of a skin fold.  I performed a wide elliptical incision with a scalpel including the skin fold and the previous scars.  I then dissected down to the subcutaneous tissue with electrocautery.  The patient had thin purulence and areas of granulation tissue consistent with hidradenitis.  I excised 15 cm x 4 cm x 1 cm of skin and subtenons tissue including the granulation tissue.  I then achieved hemostasis with cautery.  I anesthetized the wound circumferentially with Marcaine.  I irrigated the wound with saline.  Given her obesity and the location, the incision was very difficult to reapproximate.  I had reapproximated in a T fashion with interrupted and horizontal mattress 2-0 nylon sutures.  Gauze and tape were  then applied.  The patient tolerated the procedure well.  All the counts were correct at the end of the procedure.  She was then extubated in the operating room and taken in stable addition to the recovery room.          Abigail Miyamoto   Date: 10/26/2020  Time: 5:24 PM

## 2020-10-26 NOTE — H&P (Deleted)
  The note originally documented on this encounter has been moved the the encounter in which it belongs.  

## 2020-10-26 NOTE — Progress Notes (Signed)
Patient ID: Alexis Mathews, female   DOB: 1986/12/04, 34 y.o.   MRN: [id number]   Pre Procedure note for inpatients:   Alexis Mathews has been scheduled for Procedure(s): EXCISIONAL DEBRIDEMENT POSTERIOR NECK ABSCESS (N/A) today. The various methods of treatment have been discussed with the patient. After consideration of the risks, benefits and treatment options the patient has consented to the planned procedure.   I suspect this may be a large area of hidradenitis on the posterior neck.  My plan is to widely excised the area and then potentially close with nylons and a drain vs open packing and an eventual VAC. She just ate so will have to wait until 4 pm to do the case  The patient has been seen and labs reviewed. There are no changes in the patient's condition to prevent proceeding with the planned procedure today.  Recent labs:  Lab Results  Component Value Date   WBC 12.7 (H) 07/19/2020   HGB 11.3 (L) 07/19/2020   HCT 35.5 (L) 07/19/2020   PLT 469 (H) 07/19/2020   GLUCOSE 127 (H) 07/19/2020   ALT 12 07/17/2020   AST 13 (L) 07/17/2020   NA 136 07/19/2020   K 3.9 07/19/2020   CL 100 07/19/2020   CREATININE 0.69 07/19/2020   BUN 11 07/19/2020   CO2 26 07/19/2020   HGBA1C 6.8 (H) 07/17/2020    Alexis Miyamoto, MD 10/26/2020 10:20 AM

## 2020-10-26 NOTE — Anesthesia Preprocedure Evaluation (Signed)
Anesthesia Evaluation  Patient identified by MRN, date of birth, ID band Patient awake    Reviewed: Allergy & Precautions, NPO status , Patient's Chart, lab work & pertinent test results  History of Anesthesia Complications Negative for: history of anesthetic complications  Airway Mallampati: II  TM Distance: >3 FB Neck ROM: Limited    Dental  (+) Teeth Intact, Dental Advisory Given,    Pulmonary neg pulmonary ROS,    Pulmonary exam normal        Cardiovascular hypertension, Pt. on medications Normal cardiovascular exam     Neuro/Psych negative neurological ROS  negative psych ROS   GI/Hepatic negative GI ROS, Neg liver ROS,   Endo/Other  diabetes, Oral Hypoglycemic AgentsMorbid obesity  Renal/GU negative Renal ROS  negative genitourinary   Musculoskeletal negative musculoskeletal ROS (+)   Abdominal (+) + obese,   Peds  Hematology negative hematology ROS (+)   Anesthesia Other Findings   Reproductive/Obstetrics                             Anesthesia Physical  Anesthesia Plan  ASA: III  Anesthesia Plan: General   Post-op Pain Management:    Induction: Intravenous and Cricoid pressure planned  PONV Risk Score and Plan: 3 and Ondansetron, Dexamethasone, Treatment may vary due to age or medical condition and Midazolam  Airway Management Planned: Oral ETT  Additional Equipment: None  Intra-op Plan:   Post-operative Plan: Extubation in OR  Informed Consent: I have reviewed the patients History and Physical, chart, labs and discussed the procedure including the risks, benefits and alternatives for the proposed anesthesia with the patient or authorized representative who has indicated his/her understanding and acceptance.     Dental advisory given  Plan Discussed with: Anesthesiologist and CRNA  Anesthesia Plan Comments: (Ate at 8 am,  M-RSI planned )         Anesthesia Quick Evaluation

## 2020-10-27 ENCOUNTER — Encounter (HOSPITAL_COMMUNITY): Payer: Self-pay | Admitting: Surgery

## 2020-10-27 ENCOUNTER — Encounter (HOSPITAL_COMMUNITY): Payer: Self-pay

## 2020-10-27 DIAGNOSIS — L03221 Cellulitis of neck: Secondary | ICD-10-CM | POA: Diagnosis not present

## 2020-10-27 LAB — CBC
HCT: 40.2 % (ref 36.0–46.0)
Hemoglobin: 13.1 g/dL (ref 12.0–15.0)
MCH: 28.4 pg (ref 26.0–34.0)
MCHC: 32.6 g/dL (ref 30.0–36.0)
MCV: 87.2 fL (ref 80.0–100.0)
Platelets: 379 10*3/uL (ref 150–400)
RBC: 4.61 MIL/uL (ref 3.87–5.11)
RDW: 13 % (ref 11.5–15.5)
WBC: 11.8 10*3/uL — ABNORMAL HIGH (ref 4.0–10.5)
nRBC: 0 % (ref 0.0–0.2)

## 2020-10-27 LAB — BASIC METABOLIC PANEL
Anion gap: 8 (ref 5–15)
BUN: 7 mg/dL (ref 6–20)
CO2: 26 mmol/L (ref 22–32)
Calcium: 9.1 mg/dL (ref 8.9–10.3)
Chloride: 99 mmol/L (ref 98–111)
Creatinine, Ser: 0.66 mg/dL (ref 0.44–1.00)
GFR, Estimated: >60 mL/min (ref >60)
Glucose, Bld: 225 mg/dL — ABNORMAL HIGH (ref 70–99)
Potassium: 4 mmol/L (ref 3.5–5.1)
Sodium: 133 mmol/L — ABNORMAL LOW (ref 135–145)

## 2020-10-27 MED ORDER — IBUPROFEN 200 MG PO TABS: 400.0000 mg | ORAL_TABLET | Freq: Three times a day (TID) | ORAL | Status: DC | PRN

## 2020-10-27 MED ORDER — DOXYCYCLINE HYCLATE 100 MG PO TABS: 100.0000 mg | ORAL_TABLET | Freq: Two times a day (BID) | ORAL | 2 refills | Status: DC

## 2020-10-27 MED ORDER — DOXYCYCLINE HYCLATE 100 MG PO TABS: 100.0000 mg | ORAL_TABLET | Freq: Two times a day (BID) | ORAL | 0 refills | Status: AC

## 2020-10-27 MED ORDER — DEXMEDETOMIDINE (PRECEDEX) IN NS 20 MCG/5ML (4 MCG/ML) IV SYRINGE
PREFILLED_SYRINGE | INTRAVENOUS | Status: DC | PRN
  Administered 2020-10-26: 12 ug via INTRAVENOUS
  Administered 2020-10-26: 8 ug via INTRAVENOUS

## 2020-10-27 MED ORDER — OXYCODONE HCL 5 MG PO TABS: 5.0000 mg | ORAL_TABLET | Freq: Four times a day (QID) | ORAL | 0 refills | Status: AC | PRN

## 2020-10-27 NOTE — Progress Notes (Signed)
Patient in agreement with discharge. Discharge instructions provided and educated patient using teachback method. Supplies given for dressing changes. Patient is ambulatory and walks from unit to personal vehicle accompanied by sister.

## 2020-10-27 NOTE — Discharge Summary (Addendum)
[facility] Discharge Summary   Patient ID: Alexis Mathews MRN: [id number] DOB/AGE: 07/01/1986 34 y.o.  Admit date: 10/26/2020 Discharge date: 10/27/2020  Admitting Diagnosis: Neck abscess   Discharge Diagnosis Patient Active Problem List   Diagnosis Date Noted  . Neck abscess 10/26/2020  . Cellulitis and abscess of neck 07/17/2020  . DM II (diabetes mellitus, type II), controlled ([place]) 07/17/2020  . Hyperglycemia 07/17/2020  . Sepsis ([place]) 07/17/2020  Hidradenitis   Consultants None   Imaging: No results found.  Procedures Dr. Coralie Keens (10/26/20) - Wide excision of hidradenitis posterior neck (15 cm x 4 cm x 1 cm  skin and subcutaneous tissue)  HPI:  The patient is a 34 year old female who presented to our [place] office with a subcutaneous abscess. She is status post incision and drainage of right posterior neck abscess and left perineal abscess/hidradenitis on 07/17/20 by Dr. Redmond Pulling. She previously had a bedside I&D of posterior neck abscess in October 2020 by Dr. Harlow Asa in urgent office. It recurred requiring I&D in the OR. A Penrose drain was left in place on the posterior neck. This area healed well without signs of infection. However, she returned to urgent office on 09/22/20 with drainage from the neck incision. Dr. Hassell Done had to make several incisions to locate the abscess and entered a deep pocket evacuating a copious amount of foul-smelling fluid. She presented to the office for a recheck & discuss definitive mgmt. the area started to hurt her a few days ago.She reports tenderness at the base of her posterior neck. No fever. She does not have a primary care physician. She states her blood pressure generally runs elevated. She had an English muffin around 8 AM. No nausea or vomiting. On exam the patient had a recurrent posterior neck abscess with draining purulence, concerning for cyst vs hidradenitis so she was directly admitted to [facility] cone  hospital for surgical drainage.   Hospital Course:  She was admitted and underwent procedure listed above.  Tolerated procedure well and was transferred to the floor.  Diet was advanced as tolerated.  On POD#1, the patient was voiding well, tolerating diet, ambulating well, pain well controlled, vital signs stable, incisions c/d/i and felt stable for discharge home.  Patient will follow up in our office as below and knows to call with questions or concerns.  She  will call to confirm appointment date/time.    Physical Exam: General:  Alert, NAD, pleasant, comfortable Neck: dressing removed SS drainage on bandage. Incision closed with interrupted sutures. Mild surrounding induration/inflammatory changes but no cellulitis.   Allergies as of 10/27/2020   No Known Allergies     Medication List    TAKE these medications   acetaminophen 500 MG tablet Commonly known as: TYLENOL Take 1,000 mg by mouth every 6 (six) hours as needed for mild pain.   blood glucose meter kit and supplies Dispense based on patient and insurance preference. Use up to four times daily as directed. (FOR ICD-10 E10.9, E11.9).   blood glucose meter kit and supplies Kit Dispense based on patient and insurance preference. Use up to four times daily as directed. (FOR ICD-9 250.00, 250.01).   doxycycline 100 MG tablet Commonly known as: VIBRA-TABS Take 1 tablet (100 mg total) by mouth 2 (two) times daily for 10 days.   ibuprofen 200 MG tablet Commonly known as: ADVIL Take 2 tablets (400 mg total) by mouth every 8 (eight) hours as needed for mild pain or moderate pain.  metFORMIN 500 MG tablet Commonly known as: GLUCOPHAGE Take 1 tablet (500 mg total) by mouth daily with breakfast.   oxyCODONE 5 MG immediate release tablet Commonly known as: Oxy IR/ROXICODONE Take 1 tablet (5 mg total) by mouth every 6 (six) hours as needed for up to 5 days for moderate pain or severe pain (not releived by tylenol or ibuprofen).          Follow-up Information    Coralie Keens, MD. Go on 11/20/2020.   Specialty: General Surgery Why: for post-operative follow up. at 11:40 AM. please arrive 15 minutes early.  Contact information: [address] [place] [address] [phone]               Signed: Obie Dredge, [facility] 10/27/2020, 11:45 AM

## 2020-10-30 NOTE — Anesthesia Postprocedure Evaluation (Signed)
Anesthesia Post Note  Patient: Alexis Mathews  Procedure(s) Performed: EXCISIONAL DEBRIDEMENT POSTERIOR NECK ABSCESS (N/A Neck)     Patient location during evaluation: PACU Anesthesia Type: General Level of consciousness: awake and alert Pain management: pain level controlled Vital Signs Assessment: post-procedure vital signs reviewed and stable Respiratory status: spontaneous breathing, nonlabored ventilation, respiratory function stable and patient connected to nasal cannula oxygen Cardiovascular status: blood pressure returned to baseline and stable Postop Assessment: no apparent nausea or vomiting Anesthetic complications: no   No complications documented.  Last Vitals:  Vitals:   10/27/20 1055 10/27/20 1329  BP: (!) 166/80 138/73  Pulse: 74 67  Resp: 17 17  Temp: 36.6 C (!) 36.3 C  SpO2: 99% 98%    Last Pain:  Vitals:   10/27/20 1329  TempSrc: Oral  PainSc:                  Twyla Dais

## 2021-07-12 IMAGING — CT CT NECK W/ CM
4 of 6 series · 13 of 33 positions shown, 15 images · IV contrast (APPLIED)
Comparison: None.

CLINICAL DATA: Initial evaluation for right-sided neck abscess.

EXAM:
CT NECK WITH CONTRAST
TECHNIQUE: Multidetector CT imaging of the neck was performed using the
standard protocol following the bolus administration of intravenous
contrast.
CONTRAST:  75mL OMNIPAQUE IOHEXOL 300 MG/ML  SOLN

[Series 4: axial neck · axial · 0.52mm/px · z∈[-221,-121]mm · 3 of 101 slices shown, 4 images]
[im 26/101  soft-tissue]
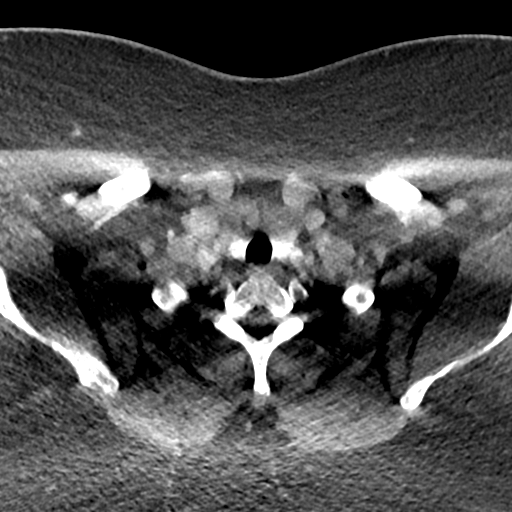
[im 26/101  bone]
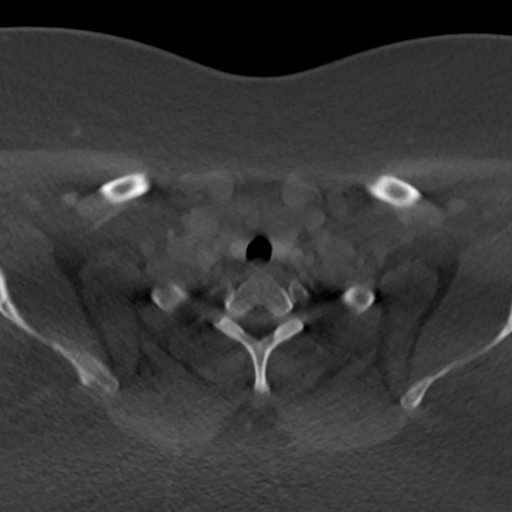
[im 51/101  bone]
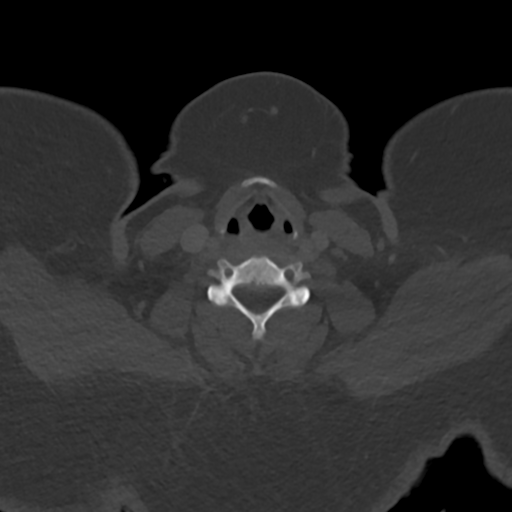
[im 76/101  bone]
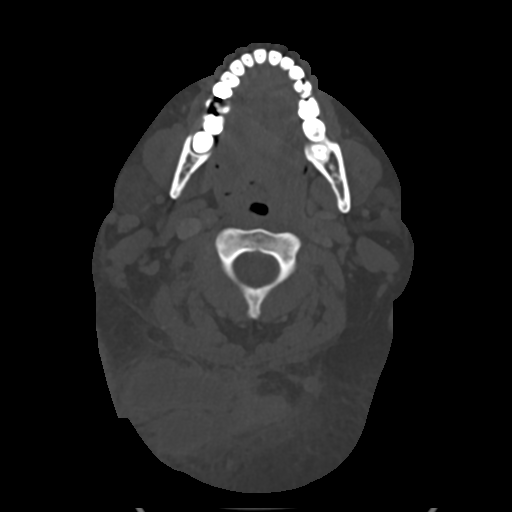

[Series 6: axial bone · axial · 0.52mm/px · z∈[-205,-139]mm · 2 of 101 slices shown]
[im 34/101  bone]
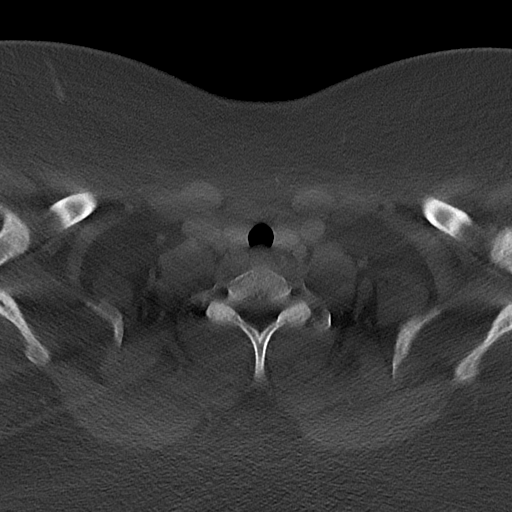
[im 67/101  bone]
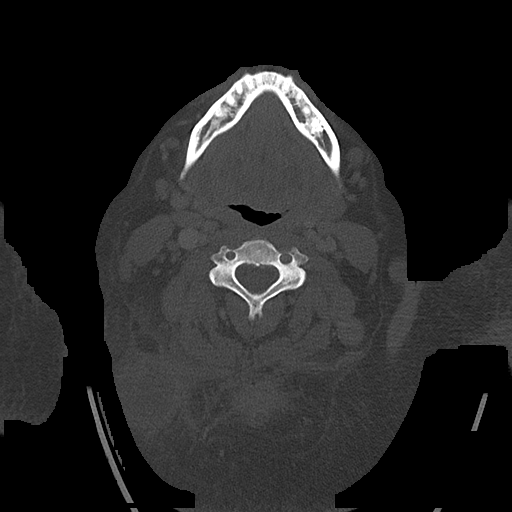

[Series 7: sag neck · sagittal · 0.40mm/px · 5 of 110 slices shown, 6 images]
[im 37/110  bone]
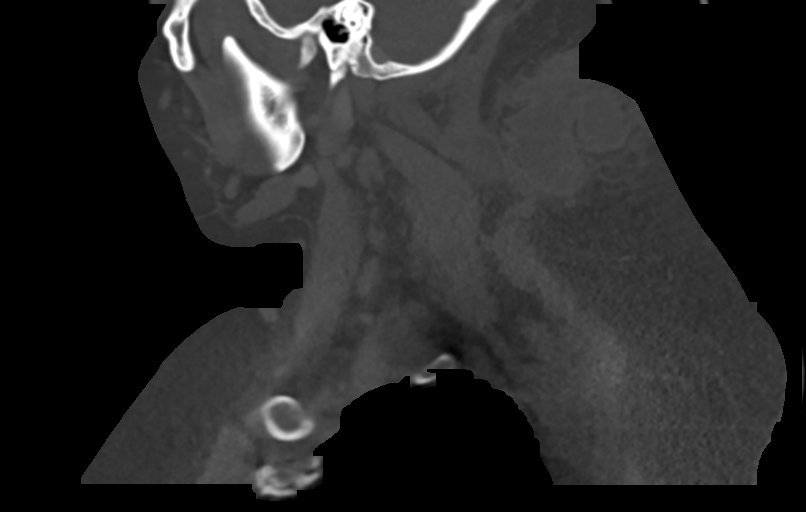
[im 46/110  bone]
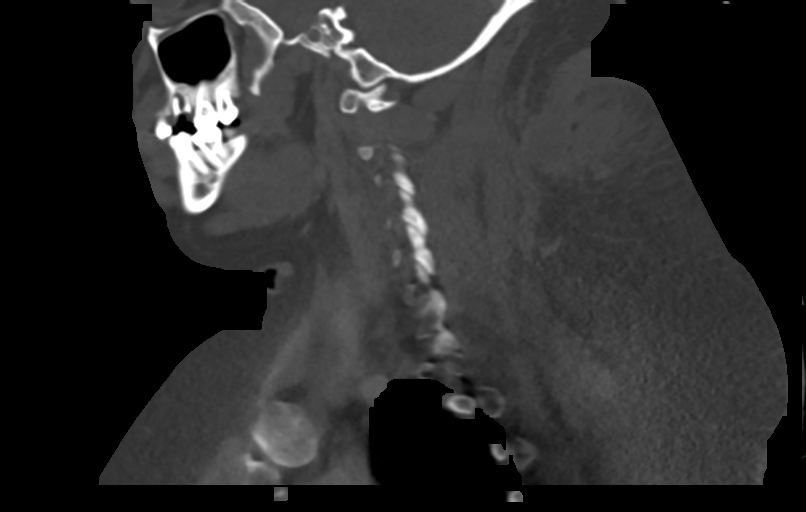
[im 55/110  soft-tissue]
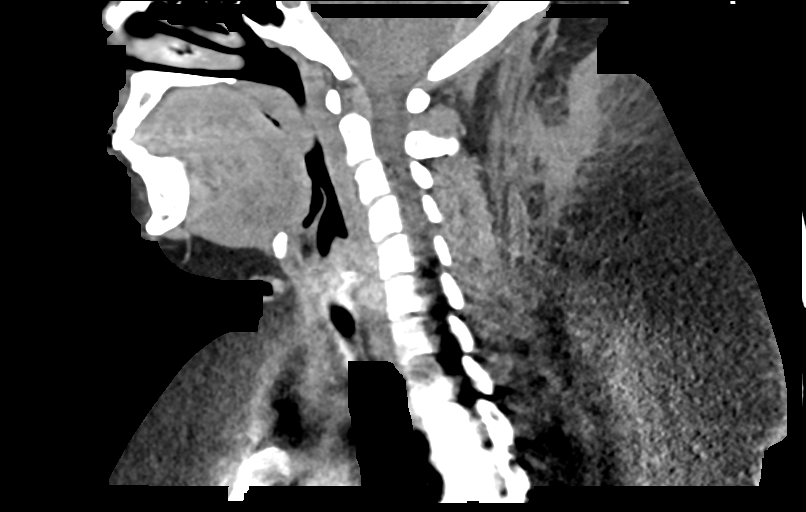
[im 55/110  bone]
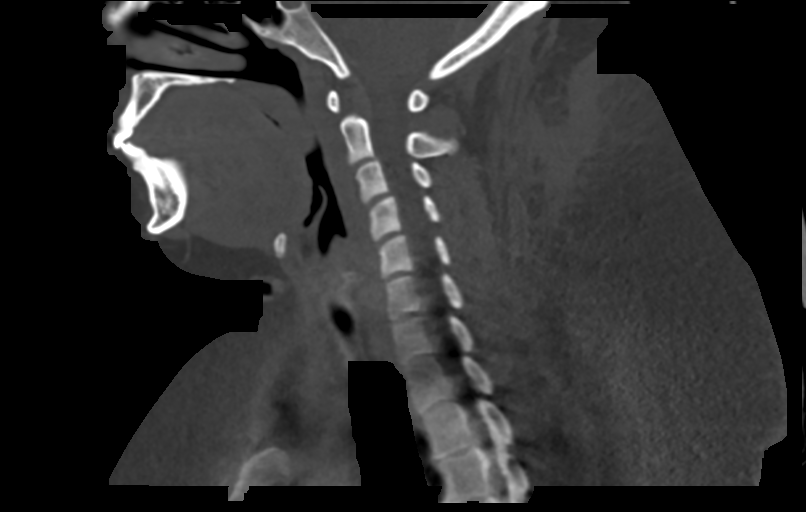
[im 64/110  bone]
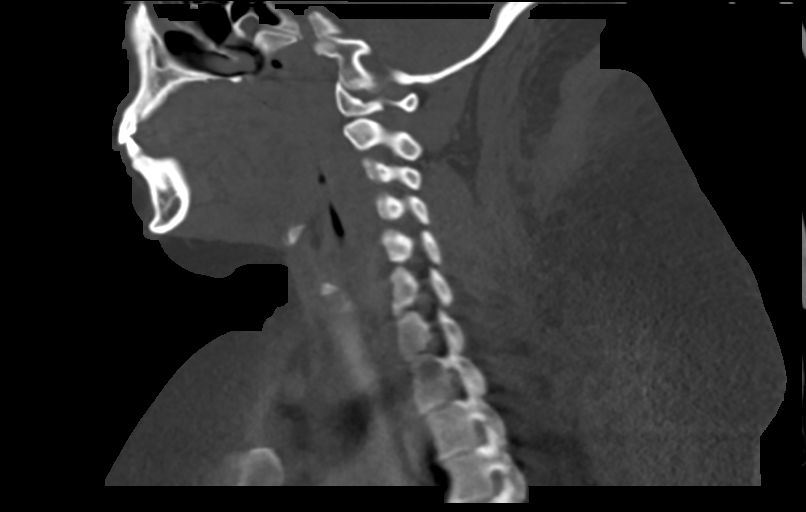
[im 73/110  bone]
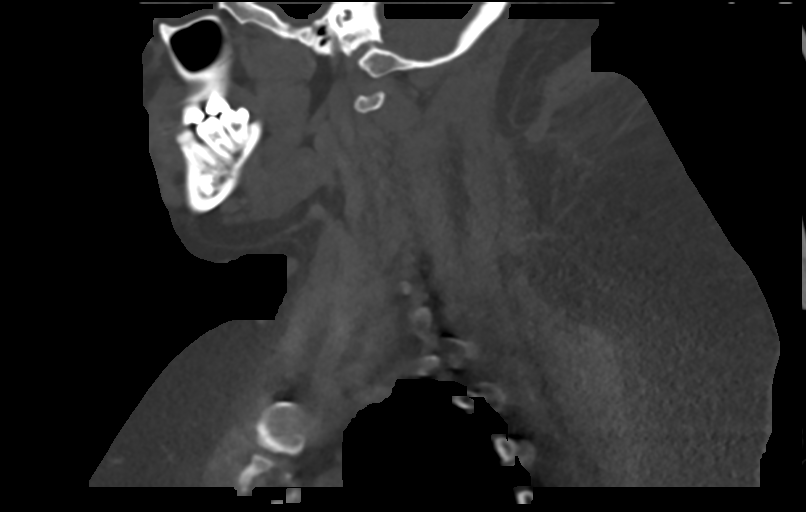

[Series 8: cor neck · coronal · 0.41mm/px · 3 of 139 slices shown]
[im 28/139  bone]
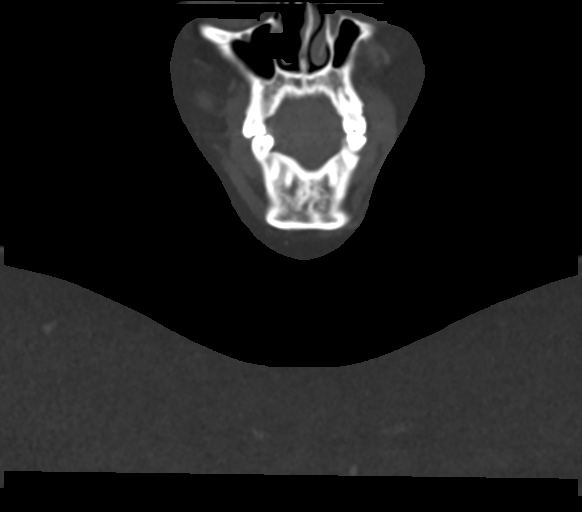
[im 56/139  bone]
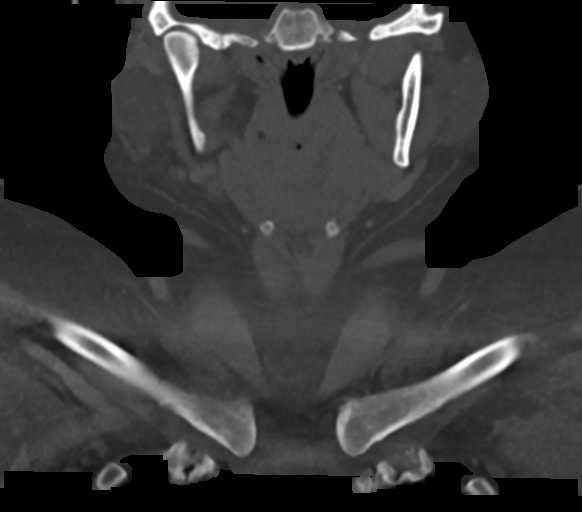
[im 83/139  bone]
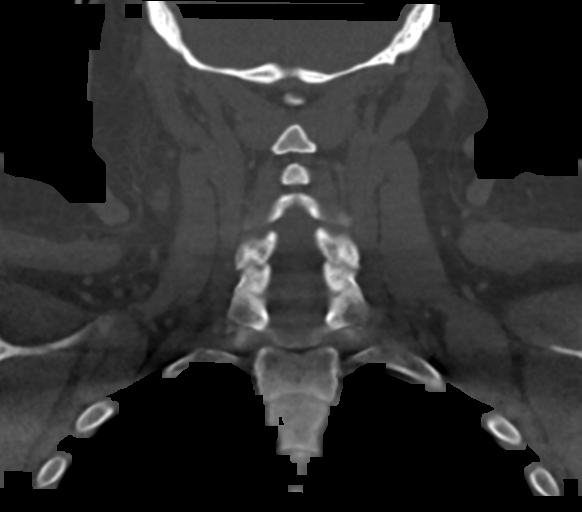

[13 of 33 positions shown; findings below may reference images not displayed]

FINDINGS: Pharynx and larynx: Oral cavity within normal limits. Few scattered
dental caries noted without acute inflammatory changes about the
teeth. Palatine tonsils symmetric and within normal limits.
Parapharyngeal fat maintained. Remainder of the oropharynx and
nasopharynx within normal limits. No retropharyngeal collection.
Normal epiglottis. Remainder of the hypopharynx and supraglottic
larynx within normal limits. True cords symmetric and normal.
Subglottic airway clear.

Salivary glands: Salivary glands including the parotid and
submandibular glands are normal.

Thyroid: Normal.

Lymph nodes: Enlarged bilateral posterior chain/level 5 lymph nodes
noted bilaterally, measuring up to 15 mm on the right and 14 mm on
the left. Prominent supraclavicular nodes measure up to 10 mm.
Findings presumably reactive. No other adenopathy within the neck.

Vascular: Normal intravascular enhancement seen within the neck.

Limited intracranial: Unremarkable.

Visualized orbits: Unremarkable.

Mastoids and visualized paranasal sinuses: Visualized paranasal
sinuses are largely clear. Visualize mastoids and middle ear
cavities are well pneumatized and free of fluid.

Skeleton: No acute osseous finding. No discrete or worrisome osseous
lesions.

Upper chest: Visualized upper chest demonstrates no acute finding.
Partially visualized lungs are grossly clear.

Other: There is an irregular multiloculated rim enhancing collection
positioned within the central and right posterior neck, consistent
with abscess. Collection measures 8.5 x 4.9 x 4.2 cm in greatest
transaxial dimensions (transverse by AP by craniocaudad).
Surrounding inflammatory stranding within the adjacent subcutaneous
fat with overlying skin thickening, compatible with regional
cellulitis. Stranding involves the underlying posterior paraspinous
musculature. No extension into the deeper spaces of the neck or
spinal canal at this time.
IMPRESSION: 1. 8.5 x 4.9 x 4.2 cm multiloculated rim enhancing collection within
the central and right posterior neck, consistent with abscess.
Surrounding inflammatory stranding within the adjacent subcutaneous
fat consistent with regional cellulitis.
2. Enlarged bilateral posterior chain/level 5 lymph nodes,
presumably reactive.

## 2021-08-30 ENCOUNTER — Ambulatory Visit (INDEPENDENT_AMBULATORY_CARE_PROVIDER_SITE_OTHER): Payer: 59 | Admitting: Internal Medicine

## 2021-08-30 ENCOUNTER — Other Ambulatory Visit: Payer: Self-pay

## 2021-08-30 ENCOUNTER — Encounter: Payer: Self-pay | Admitting: Internal Medicine

## 2021-08-30 VITALS — BP 210/100 | HR 94 | Temp 99.1°F | Ht 63.0 in | Wt 283.1 lb

## 2021-08-30 DIAGNOSIS — E1165 Type 2 diabetes mellitus with hyperglycemia: Secondary | ICD-10-CM

## 2021-08-30 DIAGNOSIS — I10 Essential (primary) hypertension: Secondary | ICD-10-CM | POA: Diagnosis not present

## 2021-08-30 MED ORDER — VALSARTAN-HYDROCHLOROTHIAZIDE 160-25 MG PO TABS: 1.0000 | ORAL_TABLET | Freq: Every day | ORAL | 1 refills | Status: DC

## 2021-08-30 NOTE — Patient Instructions (Signed)
-  Nice seeing you today!!  -Lab work today; will notify you once results are available.  -Start Valsartan HCTZ 1 tablet daily.  -Schedule follow up in 2 weeks.

## 2021-08-30 NOTE — Progress Notes (Signed)
New Patient Office Visit     This visit occurred during the SARS-CoV-2 public health emergency.  Safety protocols were in place, including screening questions prior to the visit, additional usage of staff PPE, and extensive cleaning of exam room while observing appropriate contact time as indicated for disinfecting solutions.    CC/Reason for Visit: Establish care, discuss chronic medical conditions Previous PCP: None Last Visit: Unknown  HPI: Alexis Mathews is a 35 y.o. female who is coming in today for the above mentioned reasons. Past Medical History is significant for: Morbid obesity, hypertension, type 2 diabetes.  She has not had routine medical care in years.  She works at a [employer] operational and also does [employer], [employer].  She has a sister and 2 nieces in town.  She does not smoke.  She drinks alcohol only occasionally.  She has no known drug allergies.  Her family history significant for maternal grandfather with diabetes.  She has not been taking any medications.  She is noted to have a blood pressure of 210/100 in office today, she had an A1c of 6.8 in August 2021.  She is wanting to consider weight loss surgery.   Past Medical/Surgical History: Past Medical History:  Diagnosis Date   Abscess    Anxiety    Diabetes mellitus without complication ([place])    Hidradenitis 10/26/2020   posterior neck; self reported history axillary abscesses.   Hypertension     Past Surgical History:  Procedure Laterality Date   CYST REMOVAL NECK N/A 10/26/2020   Procedure: EXCISIONAL DEBRIDEMENT POSTERIOR NECK ABSCESS;  Surgeon: Coralie Keens, MD;  Location: [facility];  Service: General;  Laterality: N/A;   INCISION AND DRAINAGE PERIRECTAL ABSCESS N/A 07/17/2020   Procedure: IRRIGATION AND DEBRIDEMENT GROIN ABSCESS;  Surgeon: Greer Pickerel, MD;  Location: [place];  Service: General;  Laterality: N/A;   IRRIGATION AND DEBRIDEMENT ABSCESS N/A 07/17/2020   Procedure: IRRIGATION AND  DEBRIDEMENT NECK ABSCESS;  Surgeon: Greer Pickerel, MD;  Location: [place];  Service: General;  Laterality: N/A;    Social History:  reports that she has never smoked. She has never used smokeless tobacco. She reports that she does not currently use alcohol. She reports that she does not use drugs.  Allergies: No Known Allergies  Family History:  Family History  Problem Relation Age of Onset   Diabetes Maternal Grandfather      Current Outpatient Medications:    acetaminophen (TYLENOL) 500 MG tablet, Take 1,000 mg by mouth every 6 (six) hours as needed for mild pain., Disp: , Rfl:    blood glucose meter kit and supplies KIT, Dispense based on patient and insurance preference. Use up to four times daily as directed. (FOR ICD-9 250.00, 250.01)., Disp: 1 each, Rfl: 0   blood glucose meter kit and supplies, Dispense based on patient and insurance preference. Use up to four times daily as directed. (FOR ICD-10 E10.9, E11.9)., Disp: 1 each, Rfl: 0   ibuprofen (ADVIL) 200 MG tablet, Take 2 tablets (400 mg total) by mouth every 8 (eight) hours as needed for mild pain or moderate pain., Disp: , Rfl:    valsartan-hydrochlorothiazide (DIOVAN-HCT) 160-25 MG tablet, Take 1 tablet by mouth daily., Disp: 90 tablet, Rfl: 1  Review of Systems:  Constitutional: Denies fever, chills, diaphoresis, appetite change and fatigue.  HEENT: Denies photophobia, eye pain, redness, hearing loss, ear pain, congestion, sore throat, rhinorrhea, sneezing, mouth sores, trouble swallowing, neck pain, neck stiffness and tinnitus.   Respiratory: Denies  SOB, DOE, cough, chest tightness,  and wheezing.   Cardiovascular: Denies chest pain, palpitations and leg swelling.  Gastrointestinal: Denies nausea, vomiting, abdominal pain, diarrhea, constipation, blood in stool and abdominal distention.  Genitourinary: Denies dysuria, urgency, frequency, hematuria, flank pain and difficulty urinating.  Endocrine: Denies: hot or cold  intolerance, sweats, changes in hair or nails, polyuria, polydipsia. Musculoskeletal: Denies myalgias, back pain, joint swelling, arthralgias and gait problem.  Skin: Denies pallor, rash and wound.  Neurological: Denies dizziness, seizures, syncope, weakness, light-headedness, numbness and headaches.  Hematological: Denies adenopathy. Easy bruising, personal or family bleeding history  Psychiatric/Behavioral: Denies suicidal ideation, mood changes, confusion, nervousness, sleep disturbance and agitation    Physical Exam: Vitals:   08/30/21 1547  BP: (!) 210/100  Pulse: 94  Temp: 99.1 F (37.3 C)  TempSrc: Oral  SpO2: 99%  Weight: 283 lb 1.6 oz (128.4 kg)  Height: '5\' 3"'  (1.6 m)   Body mass index is 50.15 kg/m.   Constitutional: NAD, calm, comfortable, obese Eyes: PERRL, lids and conjunctivae normal ENMT: Mucous membranes are moist.  Respiratory: clear to auscultation bilaterally, no wheezing, no crackles. Normal respiratory effort. No accessory muscle use.  Cardiovascular: Regular rate and rhythm, no murmurs / rubs / gallops. No extremity edema.  Neurologic: Grossly intact and nonfocal Psychiatric: Normal judgment and insight. Alert and oriented x 3. Normal mood.    Impression and Plan:  Type 2 diabetes mellitus with hyperglycemia, without long-term current use of insulin (HCC) -Check A1c today to determine if treatment is necessary.  Primary hypertension  - Plan: CBC with Differential/Platelet, Comprehensive metabolic panel, Hemoglobin A1c, Lipid panel, TSH, valsartan-hydrochlorothiazide (DIOVAN-HCT) 160-25 MG tablet, TSH, Lipid panel, Hemoglobin A1c, Comprehensive metabolic panel, CBC with Differential/Platelet -Extremely uncontrolled at 210/100 today. -Start valsartan/HCT and I will have her come back in 2 weeks for follow-up.  Morbid obesity ([place]) -Discussed healthy lifestyle, including increased physical activity and better food choices to promote weight loss. -I  think referral to bariatric surgery is an excellent idea once she has better optimized for surgery.  Time spent: 46 minutes reviewing chart, interviewing and examining patient and formulating plan of care.    Patient Instructions  -Nice seeing you today!!  -Lab work today; will notify you once results are available.  -Start Valsartan HCTZ 1 tablet daily.  -Schedule follow up in 2 weeks.   Lelon Frohlich, MD [facility] at [facility]

## 2021-08-31 LAB — CBC WITH DIFFERENTIAL/PLATELET
Basophils Absolute: 0.1 10*3/uL (ref 0.0–0.1)
Basophils Relative: 1.2 % (ref 0.0–3.0)
Eosinophils Absolute: 0.1 10*3/uL (ref 0.0–0.7)
Eosinophils Relative: 1.3 % (ref 0.0–5.0)
HCT: 42.4 % (ref 36.0–46.0)
Hemoglobin: 14.1 g/dL (ref 12.0–15.0)
Lymphocytes Relative: 29.8 % (ref 12.0–46.0)
Lymphs Abs: 2.9 10*3/uL (ref 0.7–4.0)
MCHC: 33.2 g/dL (ref 30.0–36.0)
MCV: 86.4 fl (ref 78.0–100.0)
Monocytes Absolute: 0.5 10*3/uL (ref 0.1–1.0)
Monocytes Relative: 4.7 % (ref 3.0–12.0)
Neutro Abs: 6.1 10*3/uL (ref 1.4–7.7)
Neutrophils Relative %: 63 % (ref 43.0–77.0)
Platelets: 405 10*3/uL — ABNORMAL HIGH (ref 150.0–400.0)
RBC: 4.91 Mil/uL (ref 3.87–5.11)
RDW: 13.9 % (ref 11.5–15.5)
WBC: 9.7 10*3/uL (ref 4.0–10.5)

## 2021-08-31 LAB — COMPREHENSIVE METABOLIC PANEL
ALT: 31 U/L (ref 0–35)
AST: 33 U/L (ref 0–37)
Albumin: 3.5 g/dL (ref 3.5–5.2)
Alkaline Phosphatase: 69 U/L (ref 39–117)
BUN: 8 mg/dL (ref 6–23)
CO2: 28 mEq/L (ref 19–32)
Calcium: 9.1 mg/dL (ref 8.4–10.5)
Chloride: 98 mEq/L (ref 96–112)
Creatinine, Ser: 0.7 mg/dL (ref 0.40–1.20)
GFR: 111.2 mL/min (ref >60.00)
Glucose, Bld: 224 mg/dL — ABNORMAL HIGH (ref 70–99)
Potassium: 3.7 mEq/L (ref 3.5–5.1)
Sodium: 134 mEq/L — ABNORMAL LOW (ref 135–145)
Total Bilirubin: 0.5 mg/dL (ref 0.2–1.2)
Total Protein: 9 g/dL — ABNORMAL HIGH (ref 6.0–8.3)

## 2021-08-31 LAB — LIPID PANEL
Cholesterol: 187 mg/dL (ref 0–200)
HDL: 31.1 mg/dL — ABNORMAL LOW (ref >39.00)
LDL Cholesterol: 127 mg/dL — ABNORMAL HIGH (ref 0–99)
NonHDL: 155.96
Total CHOL/HDL Ratio: 6
Triglycerides: 143 mg/dL (ref 0.0–149.0)
VLDL: 28.6 mg/dL (ref 0.0–40.0)

## 2021-08-31 LAB — TSH: TSH: 1.96 u[IU]/mL (ref 0.35–5.50)

## 2021-08-31 LAB — HEMOGLOBIN A1C: Hgb A1c MFr Bld: 8.3 % — ABNORMAL HIGH (ref 4.6–6.5)

## 2021-09-04 ENCOUNTER — Encounter: Payer: Self-pay | Admitting: Internal Medicine

## 2021-09-04 ENCOUNTER — Other Ambulatory Visit: Payer: Self-pay | Admitting: Internal Medicine

## 2021-09-04 DIAGNOSIS — E1169 Type 2 diabetes mellitus with other specified complication: Secondary | ICD-10-CM | POA: Insufficient documentation

## 2021-09-04 DIAGNOSIS — E1165 Type 2 diabetes mellitus with hyperglycemia: Secondary | ICD-10-CM

## 2021-09-04 DIAGNOSIS — E785 Hyperlipidemia, unspecified: Secondary | ICD-10-CM

## 2021-09-04 MED ORDER — METFORMIN HCL 1000 MG PO TABS: 1000.0000 mg | ORAL_TABLET | Freq: Two times a day (BID) | ORAL | 1 refills | Status: DC

## 2021-09-04 MED ORDER — ATORVASTATIN CALCIUM 40 MG PO TABS: 40.0000 mg | ORAL_TABLET | Freq: Every day | ORAL | 1 refills | Status: DC

## 2021-09-13 ENCOUNTER — Other Ambulatory Visit: Payer: Self-pay

## 2021-09-14 ENCOUNTER — Ambulatory Visit: Payer: 59 | Admitting: Internal Medicine

## 2021-09-21 ENCOUNTER — Other Ambulatory Visit: Payer: Self-pay

## 2021-09-21 ENCOUNTER — Encounter: Payer: Self-pay | Admitting: Internal Medicine

## 2021-09-21 ENCOUNTER — Ambulatory Visit (INDEPENDENT_AMBULATORY_CARE_PROVIDER_SITE_OTHER): Payer: 59 | Admitting: Internal Medicine

## 2021-09-21 VITALS — BP 140/90 | HR 89 | Temp 98.7°F | Wt 281.7 lb

## 2021-09-21 DIAGNOSIS — I10 Essential (primary) hypertension: Secondary | ICD-10-CM | POA: Diagnosis not present

## 2021-09-21 NOTE — Progress Notes (Signed)
Established Patient Office Visit     This visit occurred during the SARS-CoV-2 public health emergency.  Safety protocols were in place, including screening questions prior to the visit, additional usage of staff PPE, and extensive cleaning of exam room while observing appropriate contact time as indicated for disinfecting solutions.    CC/Reason for Visit: Follow-up blood pressure  HPI: Alexis Mathews is a 35 y.o. female who is coming in today for the above mentioned reasons. Past Medical History is significant for: Hypertension, hyperlipidemia, type 2 diabetes, morbid obesity.  She was seen for the first time on September 15.  At that time had an extremely elevated blood pressure of 210/100.  She had no signs/symptoms of endorgan damage.  She was started on valsartan/HCT 160/25 mg.  She has been tolerating medication well.  In office blood pressure today is 140/90.  She is requesting referral to bariatric surgery.   Past Medical/Surgical History: Past Medical History:  Diagnosis Date   Abscess    Anxiety    Diabetes mellitus without complication ([place])    Hidradenitis 10/26/2020   posterior neck; self reported history axillary abscesses.   Hypertension     Past Surgical History:  Procedure Laterality Date   CYST REMOVAL NECK N/A 10/26/2020   Procedure: EXCISIONAL DEBRIDEMENT POSTERIOR NECK ABSCESS;  Surgeon: Coralie Keens, MD;  Location: [place];  Service: General;  Laterality: N/A;   INCISION AND DRAINAGE PERIRECTAL ABSCESS N/A 07/17/2020   Procedure: IRRIGATION AND DEBRIDEMENT GROIN ABSCESS;  Surgeon: Greer Pickerel, MD;  Location: [place];  Service: General;  Laterality: N/A;   IRRIGATION AND DEBRIDEMENT ABSCESS N/A 07/17/2020   Procedure: IRRIGATION AND DEBRIDEMENT NECK ABSCESS;  Surgeon: Greer Pickerel, MD;  Location: [place];  Service: General;  Laterality: N/A;    Social History:  reports that she has never smoked. She has never used smokeless tobacco. She reports current  alcohol use. She reports that she does not use drugs.  Allergies: No Known Allergies  Family History:  Family History  Problem Relation Age of Onset   Diabetes Maternal Grandfather      Current Outpatient Medications:    acetaminophen (TYLENOL) 500 MG tablet, Take 1,000 mg by mouth every 6 (six) hours as needed for mild pain., Disp: , Rfl:    atorvastatin (LIPITOR) 40 MG tablet, Take 1 tablet (40 mg total) by mouth daily., Disp: 90 tablet, Rfl: 1   blood glucose meter kit and supplies KIT, Dispense based on patient and insurance preference. Use up to four times daily as directed. (FOR ICD-9 250.00, 250.01)., Disp: 1 each, Rfl: 0   blood glucose meter kit and supplies, Dispense based on patient and insurance preference. Use up to four times daily as directed. (FOR ICD-10 E10.9, E11.9)., Disp: 1 each, Rfl: 0   ibuprofen (ADVIL) 200 MG tablet, Take 2 tablets (400 mg total) by mouth every 8 (eight) hours as needed for mild pain or moderate pain., Disp: , Rfl:    metFORMIN (GLUCOPHAGE) 1000 MG tablet, Take 1 tablet (1,000 mg total) by mouth 2 (two) times daily with a meal., Disp: 180 tablet, Rfl: 1   valsartan-hydrochlorothiazide (DIOVAN-HCT) 160-25 MG tablet, Take 1 tablet by mouth daily., Disp: 90 tablet, Rfl: 1  Review of Systems:  Constitutional: Denies fever, chills, diaphoresis, appetite change and fatigue.  HEENT: Denies photophobia, eye pain, redness, hearing loss, ear pain, congestion, sore throat, rhinorrhea, sneezing, mouth sores, trouble swallowing, neck pain, neck stiffness and tinnitus.   Respiratory: Denies SOB, DOE,  cough, chest tightness,  and wheezing.   Cardiovascular: Denies chest pain, palpitations and leg swelling.  Gastrointestinal: Denies nausea, vomiting, abdominal pain, diarrhea, constipation, blood in stool and abdominal distention.  Genitourinary: Denies dysuria, urgency, frequency, hematuria, flank pain and difficulty urinating.  Endocrine: Denies: hot or cold  intolerance, sweats, changes in hair or nails, polyuria, polydipsia. Musculoskeletal: Denies myalgias, back pain, joint swelling, arthralgias and gait problem.  Skin: Denies pallor, rash and wound.  Neurological: Denies dizziness, seizures, syncope, weakness, light-headedness, numbness and headaches.  Hematological: Denies adenopathy. Easy bruising, personal or family bleeding history  Psychiatric/Behavioral: Denies suicidal ideation, mood changes, confusion, nervousness, sleep disturbance and agitation    Physical Exam: Vitals:   09/21/21 1604  BP: 140/90  Pulse: 89  Temp: 98.7 F (37.1 C)  TempSrc: Oral  SpO2: 97%  Weight: 281 lb 11.2 oz (127.8 kg)    Body mass index is 49.9 kg/m.   Constitutional: NAD, calm, comfortable Eyes: PERRL, lids and conjunctivae normal, wears corrective lenses ENMT: Mucous membranes are moist.  Respiratory: clear to auscultation bilaterally, no wheezing, no crackles. Normal respiratory effort. No accessory muscle use.  Cardiovascular: Regular rate and rhythm, no murmurs / rubs / gallops. No extremity edema.  Neurologic: Grossly intact and nonfocal Psychiatric: Normal judgment and insight. Alert and oriented x 3. Normal mood.    Impression and Plan:  Primary hypertension -Blood pressure is significantly improved, no medication changes today as she has only been on this new combination for 2 weeks and I anticipate some further lowering over the next month or so.  Morbid obesity ([place]) -Discussed healthy lifestyle, including increased physical activity and better food choices to promote weight loss. -Referral to bariatric surgery placed today per request, I agree this is a good option for her.     Lelon Frohlich, MD [facility] at [facility]

## 2021-12-27 ENCOUNTER — Ambulatory Visit (INDEPENDENT_AMBULATORY_CARE_PROVIDER_SITE_OTHER): Payer: 59 | Admitting: Internal Medicine

## 2021-12-27 ENCOUNTER — Encounter: Payer: Self-pay | Admitting: Internal Medicine

## 2021-12-27 VITALS — BP 138/90 | HR 88 | Temp 98.0°F | Ht 63.0 in | Wt 277.2 lb

## 2021-12-27 DIAGNOSIS — E1165 Type 2 diabetes mellitus with hyperglycemia: Secondary | ICD-10-CM

## 2021-12-27 DIAGNOSIS — I10 Essential (primary) hypertension: Secondary | ICD-10-CM | POA: Diagnosis not present

## 2021-12-27 DIAGNOSIS — E785 Hyperlipidemia, unspecified: Secondary | ICD-10-CM

## 2021-12-27 DIAGNOSIS — E1169 Type 2 diabetes mellitus with other specified complication: Secondary | ICD-10-CM

## 2021-12-27 DIAGNOSIS — Z23 Encounter for immunization: Secondary | ICD-10-CM | POA: Diagnosis not present

## 2021-12-27 LAB — POCT GLYCOSYLATED HEMOGLOBIN (HGB A1C): HbA1c, POC (controlled diabetic range): 6.8 % (ref 0.0–7.0)

## 2021-12-27 MED ORDER — VALSARTAN-HYDROCHLOROTHIAZIDE 160-25 MG PO TABS: 1.0000 | ORAL_TABLET | Freq: Every day | ORAL | 1 refills | Status: DC

## 2021-12-27 MED ORDER — ATORVASTATIN CALCIUM 40 MG PO TABS: 40.0000 mg | ORAL_TABLET | Freq: Every day | ORAL | 1 refills | Status: DC

## 2021-12-27 MED ORDER — AMLODIPINE BESYLATE 5 MG PO TABS: 5.0000 mg | ORAL_TABLET | Freq: Every day | ORAL | 1 refills | Status: DC

## 2021-12-27 MED ORDER — METFORMIN HCL 1000 MG PO TABS: 1000.0000 mg | ORAL_TABLET | Freq: Two times a day (BID) | ORAL | 1 refills | Status: DC

## 2021-12-27 NOTE — Patient Instructions (Signed)
-  Nice seeing you today!!  -Tdap vaccine today.  -Consider your flu and COVID vaccines.  -Start amlodipine 5 mg daily in addition to other medications.  -Schedule a follow up in 3 months.

## 2021-12-27 NOTE — Progress Notes (Signed)
Established Patient Office Visit     This visit occurred during the SARS-CoV-2 public health emergency.  Safety protocols were in place, including screening questions prior to the visit, additional usage of staff PPE, and extensive cleaning of exam room while observing appropriate contact time as indicated for disinfecting solutions.    CC/Reason for Visit: Follow-up chronic medical conditions  HPI: Alexis Mathews is a 36 y.o. female who is coming in today for the above mentioned reasons. Past Medical History is significant for: Hypertension, hyperlipidemia, type 2 diabetes, morbid obesity.  She is feeling well and has no acute concerns today.  She is going to the process with bariatric surgery.  She is overdue for flu, COVID vaccines but declines despite counseling.  She is willing to get Tdap today.  Her blood pressure remains uncontrolled.  She has not been taking her atorvastatin.   Past Medical/Surgical History: Past Medical History:  Diagnosis Date   Abscess    Anxiety    Diabetes mellitus without complication ([place])    Hidradenitis 10/26/2020   posterior neck; self reported history axillary abscesses.   Hypertension     Past Surgical History:  Procedure Laterality Date   CYST REMOVAL NECK N/A 10/26/2020   Procedure: EXCISIONAL DEBRIDEMENT POSTERIOR NECK ABSCESS;  Surgeon: Coralie Keens, MD;  Location: [place];  Service: General;  Laterality: N/A;   INCISION AND DRAINAGE PERIRECTAL ABSCESS N/A 07/17/2020   Procedure: IRRIGATION AND DEBRIDEMENT GROIN ABSCESS;  Surgeon: Greer Pickerel, MD;  Location: [place];  Service: General;  Laterality: N/A;   IRRIGATION AND DEBRIDEMENT ABSCESS N/A 07/17/2020   Procedure: IRRIGATION AND DEBRIDEMENT NECK ABSCESS;  Surgeon: Greer Pickerel, MD;  Location: [place];  Service: General;  Laterality: N/A;    Social History:  reports that she has never smoked. She has never used smokeless tobacco. She reports current alcohol use. She reports that she  does not use drugs.  Allergies: No Known Allergies  Family History:  Family History  Problem Relation Age of Onset   Diabetes Maternal Grandfather      Current Outpatient Medications:    acetaminophen (TYLENOL) 500 MG tablet, Take 1,000 mg by mouth every 6 (six) hours as needed for mild pain., Disp: , Rfl:    amLODipine (NORVASC) 5 MG tablet, Take 1 tablet (5 mg total) by mouth daily., Disp: 90 tablet, Rfl: 1   blood glucose meter kit and supplies KIT, Dispense based on patient and insurance preference. Use up to four times daily as directed. (FOR ICD-9 250.00, 250.01)., Disp: 1 each, Rfl: 0   blood glucose meter kit and supplies, Dispense based on patient and insurance preference. Use up to four times daily as directed. (FOR ICD-10 E10.9, E11.9)., Disp: 1 each, Rfl: 0   ibuprofen (ADVIL) 200 MG tablet, Take 2 tablets (400 mg total) by mouth every 8 (eight) hours as needed for mild pain or moderate pain., Disp: , Rfl:    atorvastatin (LIPITOR) 40 MG tablet, Take 1 tablet (40 mg total) by mouth daily., Disp: 90 tablet, Rfl: 1   metFORMIN (GLUCOPHAGE) 1000 MG tablet, Take 1 tablet (1,000 mg total) by mouth 2 (two) times daily with a meal., Disp: 180 tablet, Rfl: 1   valsartan-hydrochlorothiazide (DIOVAN-HCT) 160-25 MG tablet, Take 1 tablet by mouth daily., Disp: 90 tablet, Rfl: 1  Review of Systems:  Constitutional: Denies fever, chills, diaphoresis, appetite change and fatigue.  HEENT: Denies photophobia, eye pain, redness, hearing loss, ear pain, congestion, sore throat, rhinorrhea, sneezing, mouth  sores, trouble swallowing, neck pain, neck stiffness and tinnitus.   Respiratory: Denies SOB, DOE, cough, chest tightness,  and wheezing.   Cardiovascular: Denies chest pain, palpitations and leg swelling.  Gastrointestinal: Denies nausea, vomiting, abdominal pain, diarrhea, constipation, blood in stool and abdominal distention.  Genitourinary: Denies dysuria, urgency, frequency, hematuria,  flank pain and difficulty urinating.  Endocrine: Denies: hot or cold intolerance, sweats, changes in hair or nails, polyuria, polydipsia. Musculoskeletal: Denies myalgias, back pain, joint swelling, arthralgias and gait problem.  Skin: Denies pallor, rash and wound.  Neurological: Denies dizziness, seizures, syncope, weakness, light-headedness, numbness and headaches.  Hematological: Denies adenopathy. Easy bruising, personal or family bleeding history  Psychiatric/Behavioral: Denies suicidal ideation, mood changes, confusion, nervousness, sleep disturbance and agitation    Physical Exam: Vitals:   12/27/21 1521  BP: 138/90  Pulse: 88  Temp: 98 F (36.7 C)  TempSrc: Oral  SpO2: 98%  Weight: 277 lb 4 oz (125.8 kg)  Height: _0  (1.6 m)    Body mass index is 49.11 kg/m.   Constitutional: NAD, calm, comfortable, obese Eyes: PERRL, lids and conjunctivae normal, wears corrective lenses ENMT: Mucous membranes are moist.  Respiratory: clear to auscultation bilaterally, no wheezing, no crackles. Normal respiratory effort. No accessory muscle use.  Cardiovascular: Regular rate and rhythm, no murmurs / rubs / gallops. No extremity edema.  Neurologic: Grossly intact and nonfocal Psychiatric: Normal judgment and insight. Alert and oriented x 3. Normal mood.    Impression and Plan:  Type 2 diabetes mellitus with hyperglycemia, without long-term current use of insulin ([place])  - Plan: POC HgB A1c, metFORMIN (GLUCOPHAGE) 1000 MG tablet -Well-controlled with an A1c of 6.8 today.  Morbid obesity ([place]) -Discussed healthy lifestyle, including increased physical activity and better food choices to promote weight loss. -She will continue to work in the process towards weight loss surgery.  Primary hypertension  - Plan: amLODipine (NORVASC) 5 MG tablet, valsartan-hydrochlorothiazide (DIOVAN-HCT) 160-25 MG tablet -Not well controlled on Diovan HCT, add amlodipine 5 mg and return in 3 months  for follow-up.  Hyperlipidemia associated with type 2 diabetes mellitus ([place])  - Plan: atorvastatin (LIPITOR) 40 MG tablet -I have enforced importance of medical adherence, she will return in 3 months for follow-up, goal LDL is less than 70.  Need for Tdap vaccination  - Plan: Tdap vaccine greater than or equal to 7yo IM  Time spent: 32 minutes reviewing chart, interviewing and examining patient and formulating plan of care   Patient Instructions  -Nice seeing you today!!  -Tdap vaccine today.  -Consider your flu and COVID vaccines.  -Start amlodipine 5 mg daily in addition to other medications.  -Schedule a follow up in 3 months.    Lelon Frohlich, MD [facility] at [facility]

## 2022-01-09 ENCOUNTER — Ambulatory Visit (INDEPENDENT_AMBULATORY_CARE_PROVIDER_SITE_OTHER): Payer: 59 | Admitting: Internal Medicine

## 2022-01-09 ENCOUNTER — Encounter: Payer: Self-pay | Admitting: Internal Medicine

## 2022-01-09 NOTE — Progress Notes (Signed)
Established Patient Office Visit     This visit occurred during the SARS-CoV-2 public health emergency.  Safety protocols were in place, including screening questions prior to the visit, additional usage of staff PPE, and extensive cleaning of exam room while observing appropriate contact time as indicated for disinfecting solutions.    CC/Reason for Visit: Needs forms filled out  HPI: Alexis Mathews is a 36 y.o. female who is coming in today for the above mentioned reasons. Past Medical History is significant for: Morbid obesity, hypertension, hyperlipidemia, type 2 diabetes.  She is going through the process of receiving bariatric surgery and needs some forms filled out by myself.  No acute concerns or complaints today.   Past Medical/Surgical History: Past Medical History:  Diagnosis Date   Abscess    Anxiety    Diabetes mellitus without complication ([place])    Hidradenitis 10/26/2020   posterior neck; self reported history axillary abscesses.   Hypertension     Past Surgical History:  Procedure Laterality Date   CYST REMOVAL NECK N/A 10/26/2020   Procedure: EXCISIONAL DEBRIDEMENT POSTERIOR NECK ABSCESS;  Surgeon: Coralie Keens, MD;  Location: [place];  Service: General;  Laterality: N/A;   INCISION AND DRAINAGE PERIRECTAL ABSCESS N/A 07/17/2020   Procedure: IRRIGATION AND DEBRIDEMENT GROIN ABSCESS;  Surgeon: Greer Pickerel, MD;  Location: [place];  Service: General;  Laterality: N/A;   IRRIGATION AND DEBRIDEMENT ABSCESS N/A 07/17/2020   Procedure: IRRIGATION AND DEBRIDEMENT NECK ABSCESS;  Surgeon: Greer Pickerel, MD;  Location: [place];  Service: General;  Laterality: N/A;    Social History:  reports that she has never smoked. She has never used smokeless tobacco. She reports current alcohol use. She reports that she does not use drugs.  Allergies: No Known Allergies  Family History:  Family History  Problem Relation Age of Onset   Diabetes Maternal Grandfather       Current Outpatient Medications:    acetaminophen (TYLENOL) 500 MG tablet, Take 1,000 mg by mouth every 6 (six) hours as needed for mild pain., Disp: , Rfl:    amLODipine (NORVASC) 5 MG tablet, Take 1 tablet (5 mg total) by mouth daily., Disp: 90 tablet, Rfl: 1   atorvastatin (LIPITOR) 40 MG tablet, Take 1 tablet (40 mg total) by mouth daily., Disp: 90 tablet, Rfl: 1   blood glucose meter kit and supplies KIT, Dispense based on patient and insurance preference. Use up to four times daily as directed. (FOR ICD-9 250.00, 250.01)., Disp: 1 each, Rfl: 0   blood glucose meter kit and supplies, Dispense based on patient and insurance preference. Use up to four times daily as directed. (FOR ICD-10 E10.9, E11.9)., Disp: 1 each, Rfl: 0   ibuprofen (ADVIL) 200 MG tablet, Take 2 tablets (400 mg total) by mouth every 8 (eight) hours as needed for mild pain or moderate pain., Disp: , Rfl:    metFORMIN (GLUCOPHAGE) 1000 MG tablet, Take 1 tablet (1,000 mg total) by mouth 2 (two) times daily with a meal., Disp: 180 tablet, Rfl: 1   valsartan-hydrochlorothiazide (DIOVAN-HCT) 160-25 MG tablet, Take 1 tablet by mouth daily., Disp: 90 tablet, Rfl: 1  Review of Systems:  Constitutional: Denies fever, chills, diaphoresis, appetite change and fatigue.  HEENT: Denies photophobia, eye pain, redness, hearing loss, ear pain, congestion, sore throat, rhinorrhea, sneezing, mouth sores, trouble swallowing, neck pain, neck stiffness and tinnitus.   Respiratory: Denies SOB, DOE, cough, chest tightness,  and wheezing.   Cardiovascular: Denies chest pain, palpitations and  leg swelling.  Gastrointestinal: Denies nausea, vomiting, abdominal pain, diarrhea, constipation, blood in stool and abdominal distention.  Genitourinary: Denies dysuria, urgency, frequency, hematuria, flank pain and difficulty urinating.  Endocrine: Denies: hot or cold intolerance, sweats, changes in hair or nails, polyuria, polydipsia. Musculoskeletal:  Denies myalgias, back pain, joint swelling, arthralgias and gait problem.  Skin: Denies pallor, rash and wound.  Neurological: Denies dizziness, seizures, syncope, weakness, light-headedness, numbness and headaches.  Hematological: Denies adenopathy. Easy bruising, personal or family bleeding history  Psychiatric/Behavioral: Denies suicidal ideation, mood changes, confusion, nervousness, sleep disturbance and agitation    Physical Exam: Vitals:   01/09/22 1525  BP: (!) 170/100  Pulse: 65  Resp: 20  SpO2: 98%  Weight: 279 lb (126.6 kg)  Height: '5\' 3"'  (1.6 m)    Body mass index is 49.42 kg/m.   Constitutional: NAD, calm, comfortable, obese Eyes: PERRL, lids and conjunctivae normal, wears corrective lenses ENMT: Mucous membranes are moist. .  Psychiatric: Normal judgment and insight. Alert and oriented x 3. Normal mood.    Impression and Plan:  Morbid obesity ([place]) -Forms have been completed.  Time spent: 21 minutes reviewing chart, interviewing and examining patient, filling out forms.    Lelon Frohlich, MD [facility] at [facility]

## 2022-02-15 ENCOUNTER — Other Ambulatory Visit: Payer: Self-pay | Admitting: General Surgery

## 2022-02-15 ENCOUNTER — Other Ambulatory Visit (HOSPITAL_COMMUNITY): Payer: Self-pay | Admitting: General Surgery

## 2022-02-15 DIAGNOSIS — E1169 Type 2 diabetes mellitus with other specified complication: Secondary | ICD-10-CM

## 2022-02-26 ENCOUNTER — Other Ambulatory Visit: Payer: Self-pay | Admitting: Internal Medicine

## 2022-02-26 DIAGNOSIS — I10 Essential (primary) hypertension: Secondary | ICD-10-CM

## 2022-02-26 DIAGNOSIS — E1165 Type 2 diabetes mellitus with hyperglycemia: Secondary | ICD-10-CM

## 2022-03-05 ENCOUNTER — Other Ambulatory Visit: Payer: Self-pay

## 2022-03-05 ENCOUNTER — Ambulatory Visit (HOSPITAL_COMMUNITY)
Admission: RE | Admit: 2022-03-05 | Discharge: 2022-03-05 | Disposition: A | Discharge status: Home / Self Care | Payer: 59 | Source: Ambulatory Visit | Attending: General Surgery | Admitting: General Surgery

## 2022-03-05 DIAGNOSIS — E785 Hyperlipidemia, unspecified: Secondary | ICD-10-CM | POA: Diagnosis present

## 2022-03-05 DIAGNOSIS — E1169 Type 2 diabetes mellitus with other specified complication: Secondary | ICD-10-CM

## 2022-03-06 ENCOUNTER — Encounter: Payer: Self-pay | Admitting: Skilled Nursing Facility1

## 2022-03-06 ENCOUNTER — Encounter: Payer: 59 | Attending: General Surgery | Admitting: Skilled Nursing Facility1

## 2022-03-06 DIAGNOSIS — E1165 Type 2 diabetes mellitus with hyperglycemia: Secondary | ICD-10-CM | POA: Insufficient documentation

## 2022-03-06 NOTE — Progress Notes (Signed)
Nutrition Assessment for Bariatric Surgery ?Medical Nutrition Therapy ?Appt Start Time: 4:40    End Time: 5:42 ? ?Patient was seen on 03/06/2022 for Pre-Operative Nutrition Assessment. Letter of approval faxed to [facility] bariatric surgery program coordinator on 03/06/2022.  ? ?Referral stated Supervised Weight Loss (SWL) visits needed: 3 ? ?Planned surgery: sleeve gastrectomy  ?Pt expectation of surgery: better health, stop medications  ?Pt expectation of dietitian: none stated  ? ?  ?NUTRITION ASSESSMENT ?  ?Anthropometrics  ?Start weight at [facility]: 270 lbs (date: 03/06/2022)  ?Height: 62.5 in ?BMI: 48.60 kg/m2   ?  ?Clinical  ?Medical hx: DM, hydradenitis, HTN, hyperlipidemia  ?Medications: see list: B12, metformin   ?Labs: A1C 6.8, Na 134, HDL 31.10, LDL 127 does have labs upcoming in April ?Notable signs/symptoms: N/A ?Any previous deficiencies? No ? ?Micronutrient Nutrition Focused Physical Exam: ?Hair: No issues observed ?Eyes: No issues observed ?Mouth: No issues observed ?Neck: No issues observed ?Nails: No issues observed ?Skin: No issues observed ? ?Lifestyle & Dietary Hx ? 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   ?Pt states she works 2 jobs 6am-2:30pm (5 days a week) and then 4pm-9pm (4 days a week) so 2 days a  week off with no job one day a week with just one job so working 13 hour days. 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  ?Pt states she does not eat dinner because she does not have dinner.  ?Pt state she does not like a lot of non starchy vegetables: tomato, salad greens, broccoli, spinach ? ?Offered specific meal ideas.  ? ?24-Hr Dietary Recall: 1 protein shake a day ?First Meal 9am: glucerna + crackers and cheese ?Snack:  ?Second Meal 12: bought salad or chicken salad and crackers ?Snack: chicken salad and crackers ?Third Meal: skipped ?Snack:  ?Beverages: water, orange juice, cranberry juice, protein shakes ?   ?Estimated Energy Needs ?Calories: 1600 ? ? ?NUTRITION DIAGNOSIS  ?Overweight/obesity ([place]-3.3) related to past poor dietary habits and physical inactivity as evidenced by patient w/ planned sleeve gastrectomy surgery following dietary guidelines for continued weight loss. ?  ? ?NUTRITION INTERVENTION  ?Nutrition counseling (C-1) and education (E-2) to facilitate bariatric surgery goals. ? ?Educated pt on micronutrient deficiencies post surgery and strategies to mitigate that risk ?  ?Pre-Op Goals Reviewed with the Patient ?Track food and beverage intake (pen and paper, MyFitness Pal, Baritastic app, etc.) ?Make healthy food choices while monitoring portion sizes: try spagetti squash and a vegetable every day ?Consume 3 meals per day or try to eat every 3-5 hours: eating dinner at your part time job such as tuna in a high fiber tortilla ?Avoid concentrated sugars and fried foods ?Keep sugar & fat in the single digits per serving on food labels ?Practice CHEWING your food (aim for applesauce consistency) ?Practice not drinking 15 minutes before, during, and 30 minutes after each meal and snack ?Avoid all carbonated beverages (ex: soda, sparkling beverages)  ?Limit caffeinated beverages (ex: coffee, tea, energy drinks) ?Avoid all sugar-sweetened beverages (ex: regular soda, sports drinks)  ?Avoid alcohol  ?Aim for 64-100 ounces of FLUID daily (with at least half of fluid intake being plain water)  ?Aim for at least 60-80 grams of PROTEIN daily ?Look for a liquid protein source that contains ?15 g protein and ?5 g carbohydrate (ex: shakes, drinks, shots) ?Make a list of non-food related activities ?Physical activity is an  important part of a healthy lifestyle so keep it moving! The goal is to reach 150 minutes of exercise per week, including cardiovascular and weight baring activity. ? ?*Goals that are bolded indicate the pt would like to start working towards these ? ?Handouts Provided Include  ?Bariatric Surgery  handouts (Nutrition Visits, Pre-Op Goals, Protein Shakes, Vitamins & Minerals) ? ?Learning Style & Readiness for Change ?Teaching method utilized: Visual & Auditory  ?Demonstrated degree of understanding via: Teach Back  ?Readiness Level: contemplative  ?Barriers to learning/adherence to lifestyle change: working two jobs ? ?RD's Notes for Next Visit ?Assess patient adherence to chosen goals ?  ?  ?MONITORING & EVALUATION ?Dietary intake, weekly physical activity, body weight, and pre-op goals reached at next nutrition visit.  ?  ?Next Steps  ?Patient is to follow up at [facility] for Pre-Op Class >2 weeks before surgery for further nutrition education.  ?

## 2022-03-28 ENCOUNTER — Encounter: Payer: 59 | Attending: General Surgery | Admitting: Skilled Nursing Facility1

## 2022-03-28 DIAGNOSIS — Z713 Dietary counseling and surveillance: Secondary | ICD-10-CM | POA: Insufficient documentation

## 2022-03-28 DIAGNOSIS — Z6841 Body Mass Index (BMI) 40.0 and over, adult: Secondary | ICD-10-CM | POA: Insufficient documentation

## 2022-03-28 DIAGNOSIS — I10 Essential (primary) hypertension: Secondary | ICD-10-CM | POA: Diagnosis not present

## 2022-03-28 DIAGNOSIS — E1165 Type 2 diabetes mellitus with hyperglycemia: Secondary | ICD-10-CM | POA: Insufficient documentation

## 2022-03-28 NOTE — Progress Notes (Signed)
Supervised Weight Loss Visit ?Bariatric Nutrition Education ? ?Sleeve Gastrectomy  ? ?NUTRITION ASSESSMENT ? ?Anthropometrics  ?Start weight at [facility]: 270 lbs (date: 03/06/2022)  ?Weight: 274.3 ?BMI: 49.35 kg/m2   ?  ?Clinical  ?Medical hx: DM, hydradenitis, HTN, hyperlipidemia  ?Medications: see list: B12, metformin   ?Labs: A1C 6.8, Na 134, HDL 31.10, LDL 127 does have labs upcoming April 17th ?Notable signs/symptoms: N/A ?Any previous deficiencies? No ? ?Lifestyle & Dietary Hx ? ?Pt state she is at work 6am-2pm, and another job at [employer]. ?Pt states she drinks the protein shake at 9:30pm believing to have a sleepy affect for her.  ?Pt states she has been doing a lot of celebrating with family lately. ?Pt states she has been thinking about making these changes and has been attempting the change.  ?Estimated daily fluid intake: unknown oz ?Supplements:  ?Current average weekly physical activity: ADL;s ? ?24-Hr Dietary Recall ?First Meal 6:30: smoothie yogurt and crackers ?Snack:  ?Second Meal 9 am: Lunchable and yogurt sometimes with a protein shake ?Snack:  ?Third Meal 12: rice and beans and meatballs and banana, or [place] salad ?Snack 4: protein shake or chicken salad sandwich ?Snack 9:30: protein shake ?Beverages: water, soda, smoothies from sheets ? ?Estimated Energy Needs ?Calories: 1500 ? ? ?NUTRITION DIAGNOSIS  ?Overweight/obesity ([place]-3.3) related to past poor dietary habits and physical inactivity as evidenced by patient w/ planned sleeve gastrectomy surgery following dietary guidelines for continued weight loss. ? ? ?NUTRITION INTERVENTION  ?Nutrition counseling (C-1) and education (E-2) to facilitate bariatric surgery goals. ? ?Pre-Op Goals Progress & New Goals ?Continue/NEW: Make healthy food choices while monitoring portion sizes: try spagetti squash and a vegetable every day ?Continue: Consume 3 meals per day or try to eat every 3-5 hours: eating dinner at your part time job such as tuna in a high  fiber tortilla ?NEW: choose complex carbohydrates ?NEW: read the nutrition facts label ? ?Handouts Provided Include  ?Nutrition facts label reading ?Meal ideas sheet ? ?Learning Style & Readiness for Change ?Teaching method utilized: Visual & Auditory  ?Demonstrated degree of understanding via: Teach Back  ?Readiness Level: contemplative  ?Barriers to learning/adherence to lifestyle change: none identified  ? ?RD's Notes for next Visit  ?Assess pts adherence to chosen goals ? ? ?MONITORING & EVALUATION ?Dietary intake, weekly physical activity, body weight, and pre-op goals ? ?Next Steps  ?Patient is to return to [facility] in 1 month for next SWL ?

## 2022-04-01 ENCOUNTER — Ambulatory Visit (INDEPENDENT_AMBULATORY_CARE_PROVIDER_SITE_OTHER): Payer: 59 | Admitting: Internal Medicine

## 2022-04-01 VITALS — BP 140/90 | Temp 97.4°F | Wt 269.9 lb

## 2022-04-01 DIAGNOSIS — E559 Vitamin D deficiency, unspecified: Secondary | ICD-10-CM | POA: Diagnosis not present

## 2022-04-01 DIAGNOSIS — E1169 Type 2 diabetes mellitus with other specified complication: Secondary | ICD-10-CM | POA: Diagnosis not present

## 2022-04-01 DIAGNOSIS — I10 Essential (primary) hypertension: Secondary | ICD-10-CM | POA: Diagnosis not present

## 2022-04-01 DIAGNOSIS — E1165 Type 2 diabetes mellitus with hyperglycemia: Secondary | ICD-10-CM

## 2022-04-01 DIAGNOSIS — E785 Hyperlipidemia, unspecified: Secondary | ICD-10-CM

## 2022-04-01 LAB — POCT GLYCOSYLATED HEMOGLOBIN (HGB A1C): Hemoglobin A1C: 6.2 % — AB (ref 4.0–5.6)

## 2022-04-01 MED ORDER — AMLODIPINE BESYLATE 5 MG PO TABS: 5.0000 mg | ORAL_TABLET | Freq: Every day | ORAL | 1 refills | Status: DC

## 2022-04-01 MED ORDER — VALSARTAN-HYDROCHLOROTHIAZIDE 160-25 MG PO TABS: 1.0000 | ORAL_TABLET | Freq: Every day | ORAL | 1 refills | Status: DC

## 2022-04-01 MED ORDER — METFORMIN HCL 1000 MG PO TABS: ORAL_TABLET | ORAL | 1 refills | Status: DC

## 2022-04-01 MED ORDER — ATORVASTATIN CALCIUM 40 MG PO TABS: 40.0000 mg | ORAL_TABLET | Freq: Every day | ORAL | 1 refills | Status: DC

## 2022-04-01 NOTE — Progress Notes (Signed)
? ? ? ?Established Patient Office Visit ? ? ? ? ?This visit occurred during the SARS-CoV-2 public health emergency.  Safety protocols were in place, including screening questions prior to the visit, additional usage of staff PPE, and extensive cleaning of exam room while observing appropriate contact time as indicated for disinfecting solutions.  ? ? ?CC/Reason for Visit: 71-monthfollow-up chronic medical conditions ? ?HPI: Alexis Mathews a 36y.o. female who is coming in today for the above mentioned reasons. Past Medical History is significant for: Hypertension, hyperlipidemia, type 2 diabetes, morbid obesity and vitamin D deficiency.  She is requesting lab work today.  At last visit we added Diovan HCTZ to her amlodipine for uncontrolled blood pressure.  Unfortunately she thought that that meant that she could discontinue the amlodipine.  She has not been checking blood pressure at home.  She is requesting medication refills today. ? ? ?Past Medical/Surgical History: ?Past Medical History:  ?Diagnosis Date  ? Abscess   ? Anxiety   ? Diabetes mellitus without complication ([place]   ? Hidradenitis 10/26/2020  ? posterior neck; self reported history axillary abscesses.  ? Hypertension   ? ? ?Past Surgical History:  ?Procedure Laterality Date  ? CYST REMOVAL NECK N/A 10/26/2020  ? Procedure: EXCISIONAL DEBRIDEMENT POSTERIOR NECK ABSCESS;  Surgeon: BCoralie Keens MD;  [facility]  Service: General;  Laterality: N/A;  ? ICommodoreN/A 07/17/2020  ? Procedure: IRRIGATION AND DEBRIDEMENT GROIN ABSCESS;  Surgeon: WGreer Pickerel MD;  [facility]  Service: General;  Laterality: N/A;  ? IPlevnaN/A 07/17/2020  ? Procedure: IRRIGATION AND DEBRIDEMENT NECK ABSCESS;  Surgeon: WGreer Pickerel MD;  Location[facility]  Service: General;  Laterality: N/A;  ? ? ?Social History: ? reports that she has never smoked. She has never used smokeless tobacco. She reports current  alcohol use. She reports that she does not use drugs. ? ?Allergies: ?No Known Allergies ? ?Family History:  ?Family History  ?Problem Relation Age of Onset  ? Diabetes Maternal Grandfather   ? ? ? ?Current Outpatient Medications:  ?  acetaminophen (TYLENOL) 500 MG tablet, Take 1,000 mg by mouth every 6 (six) hours as needed for mild pain., Disp: , Rfl:  ?  blood glucose meter kit and supplies KIT, Dispense based on patient and insurance preference. Use up to four times daily as directed. (FOR ICD-9 250.00, 250.01)., Disp: 1 each, Rfl: 0 ?  blood glucose meter kit and supplies, Dispense based on patient and insurance preference. Use up to four times daily as directed. (FOR ICD-10 E10.9, E11.9)., Disp: 1 each, Rfl: 0 ?  ibuprofen (ADVIL) 200 MG tablet, Take 2 tablets (400 mg total) by mouth every 8 (eight) hours as needed for mild pain or moderate pain., Disp: , Rfl:  ?  amLODipine (NORVASC) 5 MG tablet, Take 1 tablet (5 mg total) by mouth daily., Disp: 90 tablet, Rfl: 1 ?  atorvastatin (LIPITOR) 40 MG tablet, Take 1 tablet (40 mg total) by mouth daily., Disp: 90 tablet, Rfl: 1 ?  metFORMIN (GLUCOPHAGE) 1000 MG tablet, TAKE 1 TABLET(1000 MG) BY MOUTH TWICE DAILY WITH A MEAL, Disp: 180 tablet, Rfl: 1 ?  valsartan-hydrochlorothiazide (DIOVAN-HCT) 160-25 MG tablet, Take 1 tablet by mouth daily., Disp: 90 tablet, Rfl: 1 ? ?Review of Systems:  ?Constitutional: Denies fever, chills, diaphoresis, appetite change and fatigue.  ?HEENT: Denies photophobia, eye pain, redness, hearing loss, ear pain, congestion, sore throat, rhinorrhea, sneezing, mouth sores, trouble  swallowing, neck pain, neck stiffness and tinnitus.   ?Respiratory: Denies SOB, DOE, cough, chest tightness,  and wheezing.   ?Cardiovascular: Denies chest pain, palpitations and leg swelling.  ?Gastrointestinal: Denies nausea, vomiting, abdominal pain, diarrhea, constipation, blood in stool and abdominal distention.  ?Genitourinary: Denies dysuria, urgency,  frequency, hematuria, flank pain and difficulty urinating.  ?Endocrine: Denies: hot or cold intolerance, sweats, changes in hair or nails, polyuria, polydipsia. ?Musculoskeletal: Denies myalgias, back pain, joint swelling, arthralgias and gait problem.  ?Skin: Denies pallor, rash and wound.  ?Neurological: Denies dizziness, seizures, syncope, weakness, light-headedness, numbness and headaches.  ?Hematological: Denies adenopathy. Easy bruising, personal or family bleeding history  ?Psychiatric/Behavioral: Denies suicidal ideation, mood changes, confusion, nervousness, sleep disturbance and agitation ? ? ? ?Physical Exam: ?Vitals:  ? 04/01/22 1529  ?BP: 140/90  ?Temp: (!) 97.4 ?F (36.3 ?C)  ?TempSrc: Oral  ?Weight: 269 lb 14.4 oz (122.4 kg)  ? ? ?Body mass index is 48.58 kg/m?. ? ? ?Constitutional: NAD, calm, comfortable ?Eyes: PERRL, lids and conjunctivae normal ?ENMT: Mucous membranes are moist.  ?Respiratory: clear to auscultation bilaterally, no wheezing, no crackles. Normal respiratory effort. No accessory muscle use.  ?Cardiovascular: Regular rate and rhythm, no murmurs / rubs / gallops. No extremity edema.  ?Neurologic: Grossly intact and nonfocal ?Psychiatric: Normal judgment and insight. Alert and oriented x 3. Normal mood.  ? ? ?Impression and Plan: ? ?Primary hypertension ? - Plan: amLODipine (NORVASC) 5 MG tablet, valsartan-hydrochlorothiazide (DIOVAN-HCT) 160-25 MG tablet ?-Blood pressure is uncontrolled but she has not been taking amlodipine.  She will return in 3 months for follow-up with ambulatory measurements. ?Hyperlipidemia associated with type 2 diabetes mellitus ([place])  ?- Plan: atorvastatin (LIPITOR) 40 MG tablet, Lipid panel ? ?Type 2 diabetes mellitus with hyperglycemia, without long-term current use of insulin ([place])  ?- Plan: metFORMIN (GLUCOPHAGE) 1000 MG tablet, POCT glycosylated hemoglobin (Hb A1C), Microalbumin / creatinine urine ratio, CBC with Differential/Platelet, Comprehensive  metabolic panel ?-X7O shows good control at 6.7. ? ?Vitamin D deficiency ? - Plan: VITAMIN D 25 Hydroxy (Vit-D Deficiency, Fractures) ? ? ? ?Time spent:31 minutes reviewing chart, interviewing and examining patient and formulating plan of care. ? ? ?Patient Instructions  ?-Nice seeing you today!! ? ?-Lab work today; will notify you once results are available. ? ?-Make sure you are taking meds as described on your med list. ? ?_Schedule follow up in 3 months. ? ? ? ?Lelon Frohlich, MD ?[facility] ? ? ?

## 2022-04-01 NOTE — Patient Instructions (Signed)
-  Nice seeing you today!! ? ?-Lab work today; will notify you once results are available. ? ?-Make sure you are taking meds as described on your med list. ? ?_Schedule follow up in 3 months. ?

## 2022-04-02 ENCOUNTER — Encounter: Payer: Self-pay | Admitting: Internal Medicine

## 2022-04-02 ENCOUNTER — Other Ambulatory Visit: Payer: Self-pay | Admitting: Internal Medicine

## 2022-04-02 DIAGNOSIS — E559 Vitamin D deficiency, unspecified: Secondary | ICD-10-CM | POA: Insufficient documentation

## 2022-04-02 LAB — COMPREHENSIVE METABOLIC PANEL
ALT: 19 U/L (ref 0–35)
AST: 17 U/L (ref 0–37)
Albumin: 4 g/dL (ref 3.5–5.2)
Alkaline Phosphatase: 65 U/L (ref 39–117)
BUN: 17 mg/dL (ref 6–23)
CO2: 27 mEq/L (ref 19–32)
Calcium: 9.7 mg/dL (ref 8.4–10.5)
Chloride: 97 mEq/L (ref 96–112)
Creatinine, Ser: 0.73 mg/dL (ref 0.40–1.20)
GFR: 105.3 mL/min (ref >60.00)
Glucose, Bld: 88 mg/dL (ref 70–99)
Potassium: 3.9 mEq/L (ref 3.5–5.1)
Sodium: 134 mEq/L — ABNORMAL LOW (ref 135–145)
Total Bilirubin: 0.5 mg/dL (ref 0.2–1.2)
Total Protein: 9.2 g/dL — ABNORMAL HIGH (ref 6.0–8.3)

## 2022-04-02 LAB — CBC WITH DIFFERENTIAL/PLATELET
Basophils Absolute: 0.1 10*3/uL (ref 0.0–0.1)
Basophils Relative: 0.7 % (ref 0.0–3.0)
Eosinophils Absolute: 0.2 10*3/uL (ref 0.0–0.7)
Eosinophils Relative: 1.9 % (ref 0.0–5.0)
HCT: 38.4 % (ref 36.0–46.0)
Hemoglobin: 12.8 g/dL (ref 12.0–15.0)
Lymphocytes Relative: 28.4 % (ref 12.0–46.0)
Lymphs Abs: 3.6 10*3/uL (ref 0.7–4.0)
MCHC: 33.3 g/dL (ref 30.0–36.0)
MCV: 84.7 fl (ref 78.0–100.0)
Monocytes Absolute: 1 10*3/uL (ref 0.1–1.0)
Monocytes Relative: 7.5 % (ref 3.0–12.0)
Neutro Abs: 7.9 10*3/uL — ABNORMAL HIGH (ref 1.4–7.7)
Neutrophils Relative %: 61.5 % (ref 43.0–77.0)
Platelets: 463 10*3/uL — ABNORMAL HIGH (ref 150.0–400.0)
RBC: 4.53 Mil/uL (ref 3.87–5.11)
RDW: 14.1 % (ref 11.5–15.5)
WBC: 12.8 10*3/uL — ABNORMAL HIGH (ref 4.0–10.5)

## 2022-04-02 LAB — LIPID PANEL
Cholesterol: 115 mg/dL (ref 0–200)
HDL: 31.7 mg/dL — ABNORMAL LOW (ref >39.00)
LDL Cholesterol: 63 mg/dL (ref 0–99)
NonHDL: 83.58
Total CHOL/HDL Ratio: 4
Triglycerides: 101 mg/dL (ref 0.0–149.0)
VLDL: 20.2 mg/dL (ref 0.0–40.0)

## 2022-04-02 LAB — MICROALBUMIN / CREATININE URINE RATIO
Creatinine,U: 70.8 mg/dL
Microalb Creat Ratio: 1.3 mg/g (ref 0.0–30.0)
Microalb, Ur: 0.9 mg/dL (ref 0.0–1.9)

## 2022-04-02 LAB — VITAMIN D 25 HYDROXY (VIT D DEFICIENCY, FRACTURES): VITD: 18.2 ng/mL — ABNORMAL LOW (ref 30.00–100.00)

## 2022-04-02 MED ORDER — VITAMIN D (ERGOCALCIFEROL) 1.25 MG (50000 UNIT) PO CAPS: 50000.0000 [IU] | ORAL_CAPSULE | ORAL | 0 refills | Status: AC

## 2022-04-03 ENCOUNTER — Other Ambulatory Visit: Payer: Self-pay | Admitting: Internal Medicine

## 2022-04-03 DIAGNOSIS — E559 Vitamin D deficiency, unspecified: Secondary | ICD-10-CM

## 2022-04-25 ENCOUNTER — Encounter: Payer: 59 | Attending: General Surgery | Admitting: Skilled Nursing Facility1

## 2022-04-25 ENCOUNTER — Encounter: Payer: Self-pay | Admitting: Skilled Nursing Facility1

## 2022-04-25 DIAGNOSIS — E1165 Type 2 diabetes mellitus with hyperglycemia: Secondary | ICD-10-CM

## 2022-04-25 NOTE — Progress Notes (Signed)
Supervised Weight Loss Visit ?Bariatric Nutrition Education ? ?2 of 3 SWL ? ?Sleeve Gastrectomy  ? ?NUTRITION ASSESSMENT ? ?Anthropometrics  ?Start weight at [facility]: 270 lbs (date: 03/06/2022)  ?Weight: 262.7 pounds ?BMI: 47.28 kg/m2   ?  ?Clinical  ?Medical hx: DM, hydradenitis, HTN, hyperlipidemia  ?Medications: see list: B12, metformin   ?Labs: A1C 6.8, Na 134, HDL 31.10, LDL 127 does have labs upcoming April 17th ?Notable signs/symptoms: N/A ?Any previous deficiencies? No ? ?Lifestyle & Dietary Hx ? ?Pt state she is at work 6am-2pm, and another job at [employer]. ?Pt states she drinks the protein shake at 9:30pm believing to have a sleepy affect for her.  ? ?Pt states he has not been drinking while eating.  Pt states her main motivator is knowing she wants to be successful after surgery.  ?Pt states she is going to be even better next time because she got a lunch box and bento boxes to help her with meal prep and more food variety.  ?Pt states she has not been eating outside of the home at all.  ? 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 ?24-Hr Dietary Recall ?First Meal 6:30: smoothie yogurt and crackers ?Snack:  ?Second Meal 9 am: Lunchable and yogurt sometimes with a protein shake ?Snack:  ?Third Meal 12:45: plantains and chicken ?Snack 4:30-5pm: chicken or tuna and crackers and protein shake ?Beverages: water ? ?Estimated Energy Needs ?Calories: 1500 ? ? ?NUTRITION DIAGNOSIS  ?Overweight/obesity ([place]-3.3) related to past poor dietary habits and physical inactivity as evidenced by patient w/ planned sleeve gastrectomy surgery following dietary guidelines for continued weight loss. ? ? ?NUTRITION INTERVENTION  ?Nutrition counseling (C-1) and education (E-2) to facilitate bariatric surgery goals. ? ?Pre-Op Goals Progress & New Goals ?Continue: Make healthy food choices while monitoring portion sizes: try spagetti squash and a  vegetable every day ?Continue: Consume 3 meals per day or try to eat every 3-5 hours: eating dinner at your part time job such as tuna in a high fiber tortilla ?continue: choose complex carbohydrates ?continue: read the nutrition facts label ? ?Handouts Previously Provided Include  ?Nutrition facts label reading ?Meal ideas sheet ? ?Learning Style & Readiness for Change ?Teaching method utilized: Visual & Auditory  ?Demonstrated degree of understanding via: Teach Back  ?Readiness Level: contemplative  ?Barriers to learning/adherence to lifestyle change: none identified  ? ?RD's Notes for next Visit  ?Assess pts adherence to chosen goals ? ? ?MONITORING & EVALUATION ?Dietary intake, weekly physical activity, body weight, and pre-op goals ? ?Next Steps  ?Patient is to return to [facility]  ?

## 2022-05-23 ENCOUNTER — Ambulatory Visit (INDEPENDENT_AMBULATORY_CARE_PROVIDER_SITE_OTHER): Payer: 59 | Admitting: Licensed Clinical Social Worker

## 2022-05-23 DIAGNOSIS — F4320 Adjustment disorder, unspecified: Secondary | ICD-10-CM | POA: Diagnosis not present

## 2022-05-23 NOTE — Progress Notes (Signed)
Comprehensive Clinical Assessment (CCA) Note  05/23/2022 Pryor Montes [id number]  Chief Complaint:  Chief Complaint  Patient presents with   Obesity   Visit Diagnosis: Adjustment disorder, unspecified type     CCA Biopsychosocial Intake/Chief Complaint:  Obesity, bariatric assessment  Current Symptoms/Problems: works two jobs: tired but sometimes can not sleep, minor job stress/anxiety about having a "slow time" at work and when that will end   Patient Reported Schizophrenia/Schizoaffective Diagnosis in Past: No   Strengths: art, does nails and hair, great with kids, hard worker  Preferences: prefers being close to family, doesn't prefer small spaces with lots of people,  Abilities: good childs, nail art   Type of Services Patient Feels are Needed: Bariatric procedure   Initial Clinical Notes/Concerns: History of weigh issues: since childhood and has gradually increased, Family History of obesity: younger sister, mother, cousin was overweight and had the bariatric surgery,   Co-morbid diagnosis: hypertension, pre-diabetic, high cholesterol    Diet/Eating Habits: cut out sodas, reducing carbs, stops eating after 6 pm, small portions, reducing sweets   Mental Health Symptoms Depression:   None   Duration of Depressive symptoms: No data recorded  Mania:   None   Anxiety:    Worrying   Psychosis:   None   Duration of Psychotic symptoms: No data recorded  Trauma:   None   Obsessions:   None   Compulsions:   None   Inattention:   None   Hyperactivity/Impulsivity:   None   Oppositional/Defiant Behaviors:   None   Emotional Irregularity:  No data recorded  Other Mood/Personality Symptoms:   N/A    Mental Status Exam Appearance and self-care  Stature:   Average   Weight:   Obese   Clothing:   Casual   Grooming:   Normal   Cosmetic use:   Age appropriate   Posture/gait:   Normal   Motor activity:   Not Remarkable   Sensorium   Attention:   Normal   Concentration:   Normal   Orientation:   X5   Recall/memory:   Normal   Affect and Mood  Affect:   Appropriate   Mood:   Euthymic   Relating  Eye contact:   Normal   Facial expression:   Responsive   Attitude toward examiner:   Cooperative   Thought and Language  Speech flow:  Normal   Thought content:   Appropriate to Mood and Circumstances   Preoccupation:   None   Hallucinations:   None   Organization:  No data recorded  [employer] of Knowledge:   Good   Intelligence:   Average   Abstraction:   Normal   Judgement:   Good   Reality Testing:   Adequate   Insight:   Good   Decision Making:   Normal   Social Functioning  Social Maturity:   Responsible   Social Judgement:   Normal   Stress  Stressors:   Work   Coping Ability:   Normal   Skill Deficits:   None   Supports:   Family     Religion: Religion/Spirituality Are You A Religious Person?: Yes What is Your Religious Affiliation?: [place] How Might This Affect Treatment?: Support in treatment  Leisure/Recreation: Leisure / Recreation Do You Have Hobbies?: Yes Leisure and Hobbies: hair and nails, spend time with sister, bowling, shopping  Exercise/Diet: Exercise/Diet Do You Exercise?: No (work is active) Have You Gained or Lost A Significant Amount of Weight in  the Past Six Months?: Yes-Lost Number of Pounds Lost?: 12 Do You Follow a Special Diet?: Yes Type of Diet: cut out sodas, reducing carbs, stops eating after 6 pm, small portions, reducing sweets Do You Have Any Trouble Sleeping?: Yes Explanation of Sleeping Difficulties: Limited sleep due to working two jobs, wakes up due to using water   CCA Employment/Education Employment/Work Situation: Employment / Work Situation Employment Situation: Employed Where is Patient Currently Employed?: [employer] How Long has Patient Been Employed?: 12 Are You  Satisfied With Your Job?: Yes 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, hospital) Work Stressors: None Patient's Job has Been Impacted by Current Illness: No What is the Longest Time Patient has Held a Job?: 12 Where was the Patient Employed at that Time?: [employer] Has Patient ever Been in the [employer]?: No  Education: Education Is Patient Currently Attending School?: No Last Grade Completed: 12 Name of [place]: [employer] Did You Graduate From [employer]?: Yes Did Physicist, medical?: No Did Heritage manager?: No Did You Have Any Special Interests In School?: None Did You Have An Individualized Education Program (IIEP): No Did You Have Any Difficulty At School?: No Patient's Education Has Been Impacted by Current Illness: No   CCA Family/Childhood History Family and Relationship History: Family history Marital status: Single Are you sexually active?: No What is your sexual orientation?: Heterosexual Has your sexual activity been affected by drugs, alcohol, medication, or emotional stress?: N/A Does patient have children?: No  Childhood History:  Childhood History By whom was/is the patient raised?: Mother Additional childhood history information: Mother raised her. Biological father was not in her life. Patient describes childhood as "normal." Description of patient's relationship with caregiver when they were a child: Mother: good Patient's description of current relationship with people who raised him/her: Mother: good How were you disciplined when you got in trouble as a child/adolescent?: spanked Does patient have siblings?: Yes Number of Siblings: 4 Description of patient's current relationship with siblings: 2 brothers, 2 sisters: pretty good Did patient suffer any verbal/emotional/physical/sexual abuse as a child?: No Did patient suffer from severe childhood neglect?: No Has patient ever been sexually abused/assaulted/raped as  an adolescent or adult?: No Was the patient ever a victim of a crime or a disaster?: No Witnessed domestic violence?: No Has patient been affected by domestic violence as an adult?: No  Child/Adolescent Assessment:     CCA Substance Use Alcohol/Drug Use: Alcohol / Drug Use Pain Medications: See patient MAR Prescriptions: See patient MAR Over the Counter: See patient MAR History of alcohol / drug use?: No history of alcohol / drug abuse                         ASAM's:  Six Dimensions of Multidimensional Assessment  Dimension 1:  Acute Intoxication and/or Withdrawal Potential:   Dimension 1:  Description of individual's past and current experiences of substance use and withdrawal: None  Dimension 2:  Biomedical Conditions and Complications:   Dimension 2:  Description of patient's biomedical conditions and  complications: None  Dimension 3:  Emotional, Behavioral, or Cognitive Conditions and Complications:  Dimension 3:  Description of emotional, behavioral, or cognitive conditions and complications: None  Dimension 4:  Readiness to Change:  Dimension 4:  Description of Readiness to Change criteria: None  Dimension 5:  Relapse, Continued use, or Continued Problem Potential:  Dimension 5:  Relapse, continued use, or continued  problem potential critiera description: None  Dimension 6:  Recovery/Living Environment:  Dimension 6:  Recovery/Iiving environment criteria description: None  ASAM Severity Score: ASAM's Severity Rating Score: 0  ASAM Recommended Level of Treatment:     Substance use Disorder (SUD)    Recommendations for Services/Supports/Treatments: Recommendations for Services/Supports/Treatments Recommendations For Services/Supports/Treatments: Other (Comment) (Bariatric procedure/ gastric sleeve)  DSM5 Diagnoses: Patient Active Problem List   Diagnosis Date Noted   Vitamin D deficiency 04/02/2022   Hyperlipidemia associated with type 2 diabetes mellitus  ([place]) 09/04/2021   Hypertension    Morbid obesity ([place])    Neck abscess 10/26/2020   Cellulitis and abscess of neck 07/17/2020   DM (diabetes mellitus), type 2 ([place]) 07/17/2020   Hyperglycemia 07/17/2020   Sepsis ([place]) 07/17/2020    Patient Centered Plan: Patient is on the following Treatment Plan(s):  No treatment plan needed.    Referrals to Alternative Service(s): Referred to Alternative Service(s):   Place:   Date:   Time:    Referred to Alternative Service(s):   Place:   Date:   Time:    Referred to Alternative Service(s):   Place:   Date:   Time:    Referred to Alternative Service(s):   Place:   Date:   Time:      Collaboration of Care: Other provider involved in patient's care [facility].  Patient/Guardian was advised Release of Information must be obtained prior to any record release in order to collaborate their care with an outside provider. Patient/Guardian was advised if they have not already done so to contact the registration department to sign all necessary forms in order for [place] to release information regarding their care.   Consent: Patient/Guardian gives verbal consent for treatment and assignment of benefits for services provided during this visit. Patient/Guardian expressed understanding and agreed to proceed.   Glori Bickers, LCSW

## 2022-05-30 ENCOUNTER — Encounter: Payer: Self-pay | Admitting: Skilled Nursing Facility1

## 2022-05-30 ENCOUNTER — Encounter: Payer: 59 | Attending: General Surgery | Admitting: Skilled Nursing Facility1

## 2022-05-30 DIAGNOSIS — E1165 Type 2 diabetes mellitus with hyperglycemia: Secondary | ICD-10-CM | POA: Insufficient documentation

## 2022-05-30 NOTE — Progress Notes (Signed)
Supervised Weight Loss Visit Bariatric Nutrition Education  3 of 3 SWL  Sleeve Gastrectomy   Pt completed visits.   Pt has cleared nutrition requirements.    NUTRITION ASSESSMENT  Anthropometrics  Start weight at [facility]: 270 lbs (date: 03/06/2022)  Weight: 260.3 pounds BMI: 46.83 kg/m2     Clinical  Medical hx: DM, hydradenitis, HTN, hyperlipidemia  Medications: see list: B12, metformin   Labs: A1C 6.8, Na 134, HDL 31.10, LDL 127 does have labs upcoming April 17th Notable signs/symptoms: N/A Any previous deficiencies? No  Lifestyle & Dietary Hx  Pt state she is at work 6am-2pm, and another job at [employer]. Pt states she drinks the protein shake at 9:30pm believing to have a sleepy affect for her.    Pt states she is proud of being able to cut back on her portions, not eating late at night and having gained weight, and most of all being consistent.   Pt states after surgery she will have to focus in on exercising most excited for Zumba.   Pt states she meal preps because she is so busy this ensures a healthy diet.    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   24-Hr Dietary Recall First Meal 6:30: smoothie yogurt and crackers Snack: cheese and yogurt Second Meal 12 : broccoli + shrimp + mashed potatoes Snack: glucern with peanut butter crackers Third Meal 12:45: plantains and chicken or ravioli Snack 4:30-5pm: chicken or tuna and crackers and protein shake Beverages: water, coffee  Estimated Energy Needs Calories: 1500   NUTRITION DIAGNOSIS  Overweight/obesity ([place]-3.3) related to past poor dietary habits and physical inactivity as evidenced by patient w/ planned sleeve gastrectomy surgery following dietary guidelines for continued weight loss.   NUTRITION INTERVENTION  Nutrition counseling (C-1) and education (E-2) to facilitate bariatric surgery goals.  Pre-Op Goals  Progress & New Goals Continue: Make healthy food choices while monitoring portion sizes: try spagetti squash and a vegetable every day Continue: Consume 3 meals per day or try to eat every 3-5 hours: eating dinner at your part time job such as tuna in a high fiber tortilla continue: choose complex carbohydrates continue: read the nutrition facts label  Handouts Previously Provided Include  Nutrition facts label reading Meal ideas sheet  Learning Style & Readiness for Change Teaching method utilized: Visual & Auditory  Demonstrated degree of understanding via: Teach Back  Readiness Level: contemplative  Barriers to learning/adherence to lifestyle change: none identified   RD's Notes for next Visit  Assess pts adherence to chosen goals   MONITORING & EVALUATION Dietary intake, weekly physical activity, body weight, and pre-op goals  Next Steps  Patient is to return to [facility] for pre-op class Pt has completed visits. No further supervised visits required/recomended

## 2022-06-17 ENCOUNTER — Encounter: Payer: 59 | Attending: General Surgery | Admitting: Skilled Nursing Facility1

## 2022-06-17 ENCOUNTER — Encounter: Payer: Self-pay | Admitting: Skilled Nursing Facility1

## 2022-06-17 DIAGNOSIS — E1169 Type 2 diabetes mellitus with other specified complication: Secondary | ICD-10-CM | POA: Insufficient documentation

## 2022-06-17 DIAGNOSIS — I10 Essential (primary) hypertension: Secondary | ICD-10-CM | POA: Insufficient documentation

## 2022-06-17 DIAGNOSIS — Z713 Dietary counseling and surveillance: Secondary | ICD-10-CM | POA: Diagnosis not present

## 2022-06-17 DIAGNOSIS — E1165 Type 2 diabetes mellitus with hyperglycemia: Secondary | ICD-10-CM

## 2022-06-17 NOTE — Progress Notes (Signed)
Pre-Operative Nutrition Class:    Patient was seen on 06/17/2022 for Pre-Operative Bariatric Surgery Education [facility]    Surgery date:  Surgery type: sleeve Start weight at [facility]: 270 Weight today: 262.7  Samples given per MNT protocol. Patient educated on appropriate usage: Bariatric Advantage Multivitamin Lot # [id number] Exp:08/24   Bariatric Advantage Calcium  Lot #[id number] Exp: 10/21/2022   Protein Shake Lot # [id number] Exp: [id number]  The following the learning objectives were met by the patient during this course: Identify Pre-Op Dietary Goals and will begin 2 weeks pre-operatively Identify appropriate sources of fluids and proteins  State protein recommendations and appropriate sources pre and post-operatively Identify Post-Operative Dietary Goals and will follow for 2 weeks post-operatively Identify appropriate multivitamin and calcium sources Describe the need for physical activity post-operatively and will follow MD recommendations State when to call healthcare provider regarding medication questions or post-operative complications When having a diagnosis of diabetes understanding hypoglycemia symptoms and the inclusion of 1 complex carbohydrate per meal  Handouts given during class include: Pre-Op Bariatric Surgery Diet Handout Protein Shake Handout Post-Op Bariatric Surgery Nutrition Handout BELT Program Information Flyer Support Group Information Flyer [facility] Bariatric Supplements Price List  Follow-Up Plan: Patient will follow-up at [facility] 2 weeks post operatively for diet advancement per MD.

## 2022-06-28 ENCOUNTER — Other Ambulatory Visit (HOSPITAL_COMMUNITY): Payer: Self-pay

## 2022-06-28 ENCOUNTER — Other Ambulatory Visit: Payer: Self-pay | Admitting: Internal Medicine

## 2022-06-28 DIAGNOSIS — I10 Essential (primary) hypertension: Secondary | ICD-10-CM

## 2022-06-28 DIAGNOSIS — E559 Vitamin D deficiency, unspecified: Secondary | ICD-10-CM

## 2022-06-28 NOTE — Progress Notes (Signed)
Sent message, via epic in basket, requesting order in epic from surgeon     06/28/22 1515  Preop Orders  Has preop orders? No  Name of staff/physician contacted for orders(Indicate phone or IB message) E. Andrey Campanile, MD.

## 2022-07-01 ENCOUNTER — Ambulatory Visit: Payer: Self-pay | Admitting: General Surgery

## 2022-07-01 DIAGNOSIS — E1165 Type 2 diabetes mellitus with hyperglycemia: Secondary | ICD-10-CM

## 2022-07-01 NOTE — Patient Instructions (Signed)
DUE TO COVID-19 ONLY TWO VISITORS  (aged 37 and older)  ARE ALLOWED TO COME WITH YOU AND STAY IN THE WAITING ROOM ONLY DURING PRE OP AND PROCEDURE.   **NO VISITORS ARE ALLOWED IN THE SHORT STAY AREA OR RECOVERY ROOM!!**  IF YOU WILL BE ADMITTED INTO THE HOSPITAL YOU ARE ALLOWED ONLY FOUR SUPPORT PEOPLE DURING VISITATION HOURS ONLY (7 AM -8PM)   The support person(s) must pass our screening, gel in and out, and wear a mask at all times, including in the patient's room. Patients must also wear a mask when staff or their support person are in the room. Visitors GUEST BADGE MUST BE WORN VISIBLY  One adult visitor may remain with you overnight and MUST be in the room by 8 P.M.     Your procedure is scheduled on: 07/16/22   Report to [facility] Main Entrance    Report to admitting at  9:00 AM   Call this number if you have problems the morning of surgery [phone]   Do not eat food :After 6:00 PM   you may have the following liquids until _8:00__AM/  DAY OF SURGERY  Water Black Coffee (sugar ok, NO MILK/CREAM OR CREAMERS)  Tea (sugar ok, NO MILK/CREAM OR CREAMERS) regular and decaf                             Plain Jell-O (NO RED)                                           Fruit ices (not with fruit pulp, NO RED)                                     Popsicles (NO RED)                                                                  Juice: apple, WHITE grape, WHITE cranberry Sports drinks like Gatorade (NO RED) Clear broth(vegetable,chicken,beef)                    The day of surgery:   Drink a G2 at 8:00 AM the morning of surgery. Drink in one sitting. Do not sip.  This drink was given to you during your hospital  pre-op appointment visit. Nothing else to drink after completing the  Pre-Surgery Clear Ensure or G2.     THE G2 DRINK YOU DRINK BEFORE YOU LEAVE HOME WILL BE THE LAST FLUIDS YOU DRINK BEFORE SURGERY THEN NOTHING MORE BY MOUTH.   PAIN IS EXPECTED AFTER  SURGERY AND WILL NOT BE COMPLETELY ELIMINATED.   AMBULATION AND TYLENOL WILL HELP REDUCE INCISIONAL AND GAS PAIN. MOVEMENT IS KEY!  YOU ARE EXPECTED TO BE OUT OF BED WITHIN 4 HOURS OF ADMISSION TO YOUR PATIENT ROOM.  SITTING IN THE RECLINER THROUGHOUT THE DAY IS IMPORTANT FOR DRINKING FLUIDS AND MOVING GAS THROUGHOUT THE GI TRACT.  COMPRESSION STOCKINGS SHOULD BE WORN [facility] STAY UNLESS YOU ARE WALKING.   INCENTIVE  SPIROMETER SHOULD BE USED EVERY HOUR WHILE AWAKE TO DECREASE POST-OPERATIVE COMPLICATIONS SUCH AS PNEUMONIA.  WHEN DISCHARGED HOME, IT IS IMPORTANT TO CONTINUE TO WALK EVERY HOUR AND USE THE INCENTIVE SPIROMETER EVERY HOUR    If you have questions, please contact your surgeon's office.   FOLLOW BOWEL PREP AND ANY ADDITIONAL PRE OP INSTRUCTIONS YOU RECEIVED FROM YOUR SURGEON'S OFFICE!!!     Oral Hygiene is also important to reduce your risk of infection.                                    Remember - BRUSH YOUR TEETH THE MORNING OF SURGERY WITH YOUR REGULAR TOOTHPASTE    Take these medicines the morning of surgery with A SIP OF WATER: Amlodipine  How to Manage Your Diabetes Before and After Surgery  Why is it important to control my blood sugar before and after surgery? Improving blood sugar levels before and after surgery helps healing and can limit problems. A way of improving blood sugar control is eating a healthy diet by:  Eating less sugar and carbohydrates  Increasing activity/exercise  Talking with your doctor about reaching your blood sugar goals High blood sugars (greater than 180 mg/dL) can raise your risk of infections and slow your recovery, so you will need to focus on controlling your diabetes during the weeks before surgery. Make sure that the doctor who takes care of your diabetes knows about your planned surgery including the date and location.  How do I manage my blood sugar before surgery? Check your blood sugar at least 4 times a  day, starting 2 days before surgery, to make sure that the level is not too high or low. Check your blood sugar the morning of your surgery when you wake up and every 2 hours until you get to the Short Stay unit. If your blood sugar is less than 70 mg/dL, you will need to treat for low blood sugar: Do not take insulin. Treat a low blood sugar (less than 70 mg/dL) with  cup of clear juice (cranberry or apple), 4 glucose tablets, OR glucose gel. Recheck blood sugar in 15 minutes after treatment (to make sure it is greater than 70 mg/dL). If your blood sugar is not greater than 70 mg/dL on recheck, call [phone] for further instructions. Report your blood sugar to the short stay nurse when you get to Short Stay.  If you are admitted to the hospital after surgery: Your blood sugar will be checked by the staff and you will probably be given insulin after surgery (instead of oral diabetes medicines) to make sure you have good blood sugar levels. The goal for blood sugar control after surgery is 80-180 mg/dL.   WHAT DO I DO ABOUT MY DIABETES MEDICATION?  Do not take oral diabetes medicines (pills) the morning of surgery.(Metformin)    Bring CPAP mask and tubing day of surgery.                              You may not have any metal on your body including hair pins, jewelry, and body piercing             Do not wear make-up, lotions, powders, perfumes/cologne, or deodorant  Do not wear nail polish including gel and S&S, artificial/acrylic nails, or any other type of covering on natural nails including finger  and toenails. If you have artificial nails, gel coating, etc. that needs to be removed by a nail salon please have this removed prior to surgery or surgery may need to be canceled/ delayed if the surgeon/ anesthesia feels like they are unable to be safely monitored.   Do not shave  48 hours prior to surgery.     Do not bring valuables to the hospital. [facility] IS NOT              RESPONSIBLE   FOR VALUABLES.   Contacts, dentures or bridgework may not be worn into surgery.   Bring small overnight bag day of surgery.   DO NOT BRING YOUR HOME MEDICATIONS TO THE HOSPITAL. PHARMACY WILL DISPENSE MEDICATIONS LISTED ON YOUR MEDICATION LIST TO YOU DURING YOUR ADMISSION IN THE HOSPITAL!     Special Instructions: Bring a copy of your healthcare power of attorney and living will documents the day of surgery if you haven't scanned them before.              Please read over the following fact sheets you were given: IF YOU HAVE QUESTIONS ABOUT YOUR PRE-OP INSTRUCTIONS PLEASE CALL [phone]     [facility] - Preparing for Surgery Before surgery, you can play an important role.  Because skin is not sterile, your skin needs to be as free of germs as possible.  You can reduce the number of germs on your skin by washing with CHG (chlorahexidine gluconate) soap before surgery.  CHG is an antiseptic cleaner which kills germs and bonds with the skin to continue killing germs even after washing. Please DO NOT use if you have an allergy to CHG or antibacterial soaps.  If your skin becomes reddened/irritated stop using the CHG and inform your nurse when you arrive at Short Stay. Do not shave (including legs and underarms) for at least 48 hours prior to the first CHG shower.   Please follow these instructions carefully:  1.  Shower with CHG Soap the night before surgery and the  morning of Surgery.  2.  If you choose to wash your hair, wash your hair first as usual with your  normal  shampoo.  3.  After you shampoo, rinse your hair and body thoroughly to remove the  shampoo.                            4.  Use CHG as you would any other liquid soap.  You can apply chg directly  to the skin and wash                       Gently with a scrungie or clean washcloth.  5.  Apply the CHG Soap to your body ONLY FROM THE NECK DOWN.   Do not use on face/ open                           Wound or open  sores. Avoid contact with eyes, ears mouth and genitals (private parts).                       Wash face,  Genitals (private parts) with your normal soap.             6.  Wash thoroughly, paying special attention to the area where your surgery  will be performed.  7.  Thoroughly rinse your body with warm water from the neck down.  8.  DO NOT shower/wash with your normal soap after using and rinsing off  the CHG Soap.                9.  Pat yourself dry with a clean towel.            10.  Wear clean pajamas.            11.  Place clean sheets on your bed the night of your first shower and do not  sleep with pets. Day of Surgery : Do not apply any lotions/deodorants the morning of surgery.  Please wear clean clothes to the hospital/surgery center.  FAILURE TO FOLLOW THESE INSTRUCTIONS MAY RESULT IN THE CANCELLATION OF YOUR SURGERY   ________________________________________________________________________   Incentive Spirometer  An incentive spirometer is a tool that can help keep your lungs clear and active. This tool measures how well you are filling your lungs with each breath. Taking long deep breaths may help reverse or decrease the chance of developing breathing (pulmonary) problems (especially infection) following: A long period of time when you are unable to move or be active. BEFORE THE PROCEDURE  If the spirometer includes an indicator to show your best effort, your nurse or respiratory therapist will set it to a desired goal. If possible, sit up straight or lean slightly forward. Try not to slouch. Hold the incentive spirometer in an upright position. INSTRUCTIONS FOR USE  Sit on the edge of your bed if possible, or sit up as far as you can in bed or on a chair. Hold the incentive spirometer in an upright position. Breathe out normally. Place the mouthpiece in your mouth and seal your lips tightly around it. Breathe in slowly and as deeply as possible, raising the piston or the  ball toward the top of the column. Hold your breath for 3-5 seconds or for as long as possible. Allow the piston or ball to fall to the bottom of the column. Remove the mouthpiece from your mouth and breathe out normally. Rest for a few seconds and repeat Steps 1 through 7 at least 10 times every 1-2 hours when you are awake. Take your time and take a few normal breaths between deep breaths. The spirometer may include an indicator to show your best effort. Use the indicator as a goal to work toward during each repetition. After each set of 10 deep breaths, practice coughing to be sure your lungs are clear. If you have an incision (the cut made at the time of surgery), support your incision when coughing by placing a pillow or rolled up towels firmly against it. Once you are able to get out of bed, walk around indoors and cough well. You may stop using the incentive spirometer when instructed by your caregiver.  RISKS AND COMPLICATIONS Take your time so you do not get dizzy or light-headed. If you are in pain, you may need to take or ask for pain medication before doing incentive spirometry. It is harder to take a deep breath if you are having pain. AFTER USE Rest and breathe slowly and easily. It can be helpful to keep track of a log of your progress. Your caregiver can provide you with a simple table to help with this. If you are using the spirometer at home, follow these instructions: SEEK MEDICAL CARE IF:  You are having difficultly using the spirometer. You have trouble using the  spirometer as often as instructed. Your pain medication is not giving enough relief while using the spirometer. You develop fever of 100.5 F (38.1 C) or higher. SEEK IMMEDIATE MEDICAL CARE IF:  You cough up bloody sputum that had not been present before. You develop fever of 102 F (38.9 C) or greater. You develop worsening pain at or near the incision site. MAKE SURE YOU:  Understand these instructions. Will  watch your condition. Will get help right away if you are not doing well or get worse. Document Released: 04/14/2007 Document Revised: 02/24/2012 Document Reviewed: 06/15/2007 [facility] Patient Information 2014 [employer], [place].   ________________________________________________________________________

## 2022-07-03 ENCOUNTER — Encounter: Payer: Self-pay | Admitting: Internal Medicine

## 2022-07-03 ENCOUNTER — Ambulatory Visit (INDEPENDENT_AMBULATORY_CARE_PROVIDER_SITE_OTHER): Payer: 59 | Admitting: Internal Medicine

## 2022-07-03 VITALS — BP 129/79 | HR 67 | Temp 98.0°F | Wt 256.7 lb

## 2022-07-03 DIAGNOSIS — E1169 Type 2 diabetes mellitus with other specified complication: Secondary | ICD-10-CM

## 2022-07-03 DIAGNOSIS — E1165 Type 2 diabetes mellitus with hyperglycemia: Secondary | ICD-10-CM

## 2022-07-03 DIAGNOSIS — I10 Essential (primary) hypertension: Secondary | ICD-10-CM

## 2022-07-03 DIAGNOSIS — E785 Hyperlipidemia, unspecified: Secondary | ICD-10-CM

## 2022-07-03 LAB — POCT GLYCOSYLATED HEMOGLOBIN (HGB A1C): Hemoglobin A1C: 5.9 % — AB (ref 4.0–5.6)

## 2022-07-03 MED ORDER — ATORVASTATIN CALCIUM 40 MG PO TABS: 40.0000 mg | ORAL_TABLET | Freq: Every day | ORAL | 1 refills | Status: DC

## 2022-07-03 MED ORDER — AMLODIPINE BESYLATE 5 MG PO TABS: 5.0000 mg | ORAL_TABLET | Freq: Every day | ORAL | 1 refills | Status: DC

## 2022-07-03 MED ORDER — VALSARTAN-HYDROCHLOROTHIAZIDE 160-25 MG PO TABS: 1.0000 | ORAL_TABLET | Freq: Every day | ORAL | 1 refills | Status: DC

## 2022-07-03 NOTE — Progress Notes (Signed)
Established Patient Office Visit     CC/Reason for Visit: 75-monthfollow-up chronic medical conditions  HPI: Alexis Laroseis a 36y.o. female who is coming in today for the above mentioned reasons. Past Medical History is significant for: Hypertension, hyperlipidemia, type 2 diabetes, morbid obesity and vitamin D deficiency.  She will be having a gastric sleeve surgery on August 1.  She has lost about 7 pounds since early July.  She is doing well and has no acute concerns or complaints.   Past Medical/Surgical History: Past Medical History:  Diagnosis Date   Abscess    Anxiety    Diabetes mellitus without complication ([place]    Hidradenitis 10/26/2020   posterior neck; self reported history axillary abscesses.   Hypertension     Past Surgical History:  Procedure Laterality Date   CYST REMOVAL NECK N/A 10/26/2020   Procedure: EXCISIONAL DEBRIDEMENT POSTERIOR NECK ABSCESS;  Surgeon: BCoralie Keens MD;  [place] [facility]  Service: General;  Laterality: N/A;   INCISION AND DRAINAGE PERIRECTAL ABSCESS N/A 07/17/2020   Procedure: IRRIGATION AND DEBRIDEMENT GROIN ABSCESS;  Surgeon: WGreer Pickerel MD;  [place] [facility]  Service: General;  Laterality: N/A;   IRRIGATION AND DEBRIDEMENT ABSCESS N/A 07/17/2020   Procedure: IRRIGATION AND DEBRIDEMENT NECK ABSCESS;  Surgeon: WGreer Pickerel MD;  [place] [facility]  Service: General;  Laterality: N/A;    Social History:  reports that she has never smoked. She has never used smokeless tobacco. She reports current alcohol use. She reports that she does not use drugs.  Allergies: No Known Allergies  Family History:  Family History  Problem Relation Age of Onset   Diabetes Maternal Grandfather      Current Outpatient Medications:    acetaminophen (TYLENOL) 500 MG tablet, Take 1,000 mg by mouth every 6 (six) hours as needed for mild pain., Disp: , Rfl:    B Complex-Lysine-Zn-FA (BIOTALAN CLEAR PO), Take by mouth., Disp: , Rfl:    blood  glucose meter kit and supplies KIT, Dispense based on patient and insurance preference. Use up to four times daily as directed. (FOR ICD-9 250.00, 250.01)., Disp: 1 each, Rfl: 0   blood glucose meter kit and supplies, Dispense based on patient and insurance preference. Use up to four times daily as directed. (FOR ICD-10 E10.9, E11.9)., Disp: 1 each, Rfl: 0   ibuprofen (ADVIL) 200 MG tablet, Take 2 tablets (400 mg total) by mouth every 8 (eight) hours as needed for mild pain or moderate pain., Disp: , Rfl:    metFORMIN (GLUCOPHAGE) 1000 MG tablet, TAKE 1 TABLET(1000 MG) BY MOUTH TWICE DAILY WITH A MEAL, Disp: 180 tablet, Rfl: 1   amLODipine (NORVASC) 5 MG tablet, Take 1 tablet (5 mg total) by mouth daily., Disp: 90 tablet, Rfl: 1   atorvastatin (LIPITOR) 40 MG tablet, Take 1 tablet (40 mg total) by mouth daily., Disp: 90 tablet, Rfl: 1   valsartan-hydrochlorothiazide (DIOVAN-HCT) 160-25 MG tablet, Take 1 tablet by mouth daily., Disp: 90 tablet, Rfl: 1  Review of Systems:  Constitutional: Denies fever, chills, diaphoresis, appetite change and fatigue.  HEENT: Denies photophobia, eye pain, redness, hearing loss, ear pain, congestion, sore throat, rhinorrhea, sneezing, mouth sores, trouble swallowing, neck pain, neck stiffness and tinnitus.   Respiratory: Denies SOB, DOE, cough, chest tightness,  and wheezing.   Cardiovascular: Denies chest pain, palpitations and leg swelling.  Gastrointestinal: Denies nausea, vomiting, abdominal pain, diarrhea, constipation, blood in stool and abdominal distention.  Genitourinary: Denies dysuria, urgency, frequency,  hematuria, flank pain and difficulty urinating.  Endocrine: Denies: hot or cold intolerance, sweats, changes in hair or nails, polyuria, polydipsia. Musculoskeletal: Denies myalgias, back pain, joint swelling, arthralgias and gait problem.  Skin: Denies pallor, rash and wound.  Neurological: Denies dizziness, seizures, syncope, weakness, light-headedness,  numbness and headaches.  Hematological: Denies adenopathy. Easy bruising, personal or family bleeding history  Psychiatric/Behavioral: Denies suicidal ideation, mood changes, confusion, nervousness, sleep disturbance and agitation    Physical Exam: Vitals:   07/03/22 1550 07/03/22 1554  BP: 140/88 129/79  Pulse: 67   Temp: 98 F (36.7 C)   TempSrc: Oral   SpO2: 98%   Weight: 256 lb 11.2 oz (116.4 kg)     Body mass index is 46.2 kg/m.   Constitutional: NAD, calm, comfortable Eyes: PERRL, lids and conjunctivae normal, wears corrective lenses ENMT: Mucous membranes are moist.  Respiratory: clear to auscultation bilaterally, no wheezing, no crackles. Normal respiratory effort. No accessory muscle use.  Cardiovascular: Regular rate and rhythm, no murmurs / rubs / gallops. No extremity edema.  Psychiatric: Normal judgment and insight. Alert and oriented x 3. Normal mood.    Impression and Plan:  Type 2 diabetes mellitus with hyperglycemia, without long-term current use of insulin (HCC)  - Plan: POCT glycosylated hemoglobin (Hb A1C) -A1c demonstrates excellent control at 5.9.  Primary hypertension  - Plan: amLODipine (NORVASC) 5 MG tablet, valsartan-hydrochlorothiazide (DIOVAN-HCT) 160-25 MG tablet -Well-controlled.  Hyperlipidemia associated with type 2 diabetes mellitus (HCC)  - Plan: atorvastatin (LIPITOR) 40 MG tablet  Morbid obesity (HCC) -Discussed healthy lifestyle, including increased physical activity and better food choices to promote weight loss. -Congratulated on weight loss success thus far.  For gastric sleeve surgery next month.    Time spent:32 minutes reviewing chart, interviewing and examining patient and formulating plan of care.     Alexis Hernandez Acosta, MD [facility]   

## 2022-07-05 ENCOUNTER — Other Ambulatory Visit: Payer: Self-pay

## 2022-07-05 ENCOUNTER — Encounter (HOSPITAL_COMMUNITY): Payer: Self-pay

## 2022-07-05 ENCOUNTER — Encounter (HOSPITAL_COMMUNITY)
Admission: RE | Admit: 2022-07-05 | Discharge: 2022-07-05 | Disposition: A | Discharge status: Home / Self Care | Payer: 59 | Source: Ambulatory Visit | Attending: General Surgery | Admitting: General Surgery

## 2022-07-05 DIAGNOSIS — Z01812 Encounter for preprocedural laboratory examination: Secondary | ICD-10-CM | POA: Diagnosis present

## 2022-07-05 DIAGNOSIS — E1165 Type 2 diabetes mellitus with hyperglycemia: Secondary | ICD-10-CM | POA: Diagnosis not present

## 2022-07-05 LAB — CBC WITH DIFFERENTIAL/PLATELET
Abs Immature Granulocytes: 0.03 10*3/uL (ref 0.00–0.07)
Basophils Absolute: 0.1 10*3/uL (ref 0.0–0.1)
Basophils Relative: 1 %
Eosinophils Absolute: 0.1 10*3/uL (ref 0.0–0.5)
Eosinophils Relative: 1 %
HCT: 38.6 % (ref 36.0–46.0)
Hemoglobin: 12.7 g/dL (ref 12.0–15.0)
Immature Granulocytes: 0 %
Lymphocytes Relative: 29 %
Lymphs Abs: 2.9 10*3/uL (ref 0.7–4.0)
MCH: 28.4 pg (ref 26.0–34.0)
MCHC: 32.9 g/dL (ref 30.0–36.0)
MCV: 86.4 fL (ref 80.0–100.0)
Monocytes Absolute: 0.6 10*3/uL (ref 0.1–1.0)
Monocytes Relative: 6 %
Neutro Abs: 6.4 10*3/uL (ref 1.7–7.7)
Neutrophils Relative %: 63 %
Platelets: 470 10*3/uL — ABNORMAL HIGH (ref 150–400)
RBC: 4.47 MIL/uL (ref 3.87–5.11)
RDW: 13.8 % (ref 11.5–15.5)
WBC: 10.1 10*3/uL (ref 4.0–10.5)
nRBC: 0 % (ref 0.0–0.2)

## 2022-07-05 LAB — COMPREHENSIVE METABOLIC PANEL
ALT: 19 U/L (ref 0–44)
AST: 18 U/L (ref 15–41)
Albumin: 3.6 g/dL (ref 3.5–5.0)
Alkaline Phosphatase: 46 U/L (ref 38–126)
Anion gap: 7 (ref 5–15)
BUN: 14 mg/dL (ref 6–20)
CO2: 28 mmol/L (ref 22–32)
Calcium: 9.1 mg/dL (ref 8.9–10.3)
Chloride: 102 mmol/L (ref 98–111)
Creatinine, Ser: 0.49 mg/dL (ref 0.44–1.00)
GFR, Estimated: >60 mL/min (ref >60)
Glucose, Bld: 107 mg/dL — ABNORMAL HIGH (ref 70–99)
Potassium: 3.4 mmol/L — ABNORMAL LOW (ref 3.5–5.1)
Sodium: 137 mmol/L (ref 135–145)
Total Bilirubin: 0.6 mg/dL (ref 0.3–1.2)
Total Protein: 8.7 g/dL — ABNORMAL HIGH (ref 6.5–8.1)

## 2022-07-05 LAB — TYPE AND SCREEN
ABO/RH(D): B POS
Antibody Screen: NEGATIVE

## 2022-07-05 LAB — HEMOGLOBIN A1C
Hgb A1c MFr Bld: 5.9 % — ABNORMAL HIGH (ref 4.8–5.6)
Mean Plasma Glucose: 122.63 mg/dL

## 2022-07-05 LAB — GLUCOSE, CAPILLARY: Glucose-Capillary: 127 mg/dL — ABNORMAL HIGH (ref 70–99)

## 2022-07-05 NOTE — Progress Notes (Signed)
Anesthesia note:  Bowel prep reminder:NA  PCP - Dr. Kathrene Bongo Cardiologist -no Other-   Chest x-ray - 03/07/22-epic EKG - 03/05/22-epic Stress Test - no ECHO - no Cardiac Cath - NA  Pacemaker/ICD device last checked:NA  Sleep Study - no CPAP -   Fasting Blood Sugar - 119-120 Checks Blood Sugar ___1/month__  Blood Thinner:NA Blood Thinner Instructions: Aspirin Instructions: Last Dose:  Anesthesia review: Pt reports no SOB climbing 2 flights of stairs.  Patient denies shortness of breath, fever, cough and chest pain at PAT appointment   Patient verbalized understanding of instructions that were given to them at the PAT appointment. Patient was also instructed that they will need to review over the PAT instructions again at home before surgery. Yes

## 2022-07-15 NOTE — Anesthesia Preprocedure Evaluation (Signed)
Anesthesia Evaluation  Patient identified by MRN, date of birth, ID band Patient awake    Reviewed: Allergy & Precautions, NPO status , Patient's Chart, lab work & pertinent test results  Airway Mallampati: III  TM Distance: >3 FB Neck ROM: Full    Dental no notable dental hx. (+) Teeth Intact, Dental Advisory Given   Pulmonary neg pulmonary ROS,    Pulmonary exam normal breath sounds clear to auscultation       Cardiovascular hypertension, Pt. on medications Normal cardiovascular exam Rhythm:Regular Rate:Normal     Neuro/Psych    GI/Hepatic negative GI ROS, Neg liver ROS,   Endo/Other  diabetes, Type 2  Renal/GU Lab Results      Component                Value               Date                      CREATININE               0.49                07/05/2022               K                        3.4 (L)             07/05/2022                     Musculoskeletal negative musculoskeletal ROS (+)   Abdominal (+) + obese,   Peds  Hematology Lab Results      Component                Value               Date                      HGB                      12.7                07/05/2022                HCT                      38.6                07/05/2022                PLT                      470 (H)             07/05/2022              Anesthesia Other Findings   Reproductive/Obstetrics negative OB ROS                            Anesthesia Physical Anesthesia Plan  ASA: 3  Anesthesia Plan: General   Post-op Pain Management: Lidocaine infusion*, Ketamine IV* and Toradol IV (intra-op)*   Induction: Intravenous  PONV Risk Score and Plan: 4 or greater and Treatment may vary due to age or medical condition, Midazolam, Ondansetron and  Dexamethasone  Airway Management Planned: Oral ETT  Additional Equipment: None  Intra-op Plan:   Post-operative Plan: Extubation in OR  Informed Consent:  I have reviewed the patients History and Physical, chart, labs and discussed the procedure including the risks, benefits and alternatives for the proposed anesthesia with the patient or authorized representative who has indicated his/her understanding and acceptance.     Dental advisory given  Plan Discussed with: CRNA and Anesthesiologist  Anesthesia Plan Comments:        Anesthesia Quick Evaluation

## 2022-07-16 ENCOUNTER — Ambulatory Visit (HOSPITAL_COMMUNITY): Payer: 59 | Admitting: Anesthesiology

## 2022-07-16 ENCOUNTER — Encounter (HOSPITAL_COMMUNITY): Payer: Self-pay | Admitting: General Surgery

## 2022-07-16 ENCOUNTER — Other Ambulatory Visit: Payer: Self-pay

## 2022-07-16 ENCOUNTER — Observation Stay (HOSPITAL_COMMUNITY)
Admission: AD | Admit: 2022-07-16 | Discharge: 2022-07-17 | Disposition: A | Discharge status: Home / Self Care | Payer: 59 | Attending: General Surgery | Admitting: General Surgery

## 2022-07-16 ENCOUNTER — Encounter (HOSPITAL_COMMUNITY)
Admission: AD | Disposition: A | Discharge status: Home / Self Care | Payer: Self-pay | Source: Home / Self Care | Attending: General Surgery

## 2022-07-16 DIAGNOSIS — E1169 Type 2 diabetes mellitus with other specified complication: Secondary | ICD-10-CM | POA: Insufficient documentation

## 2022-07-16 DIAGNOSIS — Z7984 Long term (current) use of oral hypoglycemic drugs: Secondary | ICD-10-CM | POA: Insufficient documentation

## 2022-07-16 DIAGNOSIS — I10 Essential (primary) hypertension: Secondary | ICD-10-CM

## 2022-07-16 DIAGNOSIS — K449 Diaphragmatic hernia without obstruction or gangrene: Secondary | ICD-10-CM | POA: Insufficient documentation

## 2022-07-16 DIAGNOSIS — E785 Hyperlipidemia, unspecified: Secondary | ICD-10-CM | POA: Insufficient documentation

## 2022-07-16 DIAGNOSIS — Z6841 Body Mass Index (BMI) 40.0 and over, adult: Secondary | ICD-10-CM | POA: Diagnosis not present

## 2022-07-16 DIAGNOSIS — E1165 Type 2 diabetes mellitus with hyperglycemia: Secondary | ICD-10-CM

## 2022-07-16 DIAGNOSIS — E559 Vitamin D deficiency, unspecified: Secondary | ICD-10-CM | POA: Insufficient documentation

## 2022-07-16 DIAGNOSIS — Z01818 Encounter for other preprocedural examination: Secondary | ICD-10-CM

## 2022-07-16 DIAGNOSIS — Z9884 Bariatric surgery status: Secondary | ICD-10-CM

## 2022-07-16 DIAGNOSIS — Z79899 Other long term (current) drug therapy: Secondary | ICD-10-CM | POA: Diagnosis not present

## 2022-07-16 DIAGNOSIS — M545 Low back pain, unspecified: Secondary | ICD-10-CM | POA: Diagnosis present

## 2022-07-16 HISTORY — PX: HIATAL HERNIA REPAIR: SHX195

## 2022-07-16 HISTORY — PX: LAPAROSCOPIC GASTRIC SLEEVE RESECTION: SHX5895

## 2022-07-16 HISTORY — PX: UPPER GI ENDOSCOPY: SHX6162

## 2022-07-16 LAB — GLUCOSE, CAPILLARY
Glucose-Capillary: 129 mg/dL — ABNORMAL HIGH (ref 70–99)
Glucose-Capillary: 160 mg/dL — ABNORMAL HIGH (ref 70–99)
Glucose-Capillary: 162 mg/dL — ABNORMAL HIGH (ref 70–99)
Glucose-Capillary: 176 mg/dL — ABNORMAL HIGH (ref 70–99)
Glucose-Capillary: 176 mg/dL — ABNORMAL HIGH (ref 70–99)

## 2022-07-16 LAB — HEMOGLOBIN AND HEMATOCRIT, BLOOD
HCT: 39.8 % (ref 36.0–46.0)
Hemoglobin: 13.2 g/dL (ref 12.0–15.0)

## 2022-07-16 LAB — ABO/RH: ABO/RH(D): B POS

## 2022-07-16 LAB — POCT PREGNANCY, URINE: Preg Test, Ur: NEGATIVE

## 2022-07-16 SURGERY — GASTRECTOMY, SLEEVE, LAPAROSCOPIC
Anesthesia: General

## 2022-07-16 MED ORDER — CHLORHEXIDINE GLUCONATE 0.12 % MT SOLN
15.0000 mL | Freq: Once | OROMUCOSAL | Status: AC
  Administered 2022-07-16: 15 mL via OROMUCOSAL

## 2022-07-16 MED ORDER — PHENYLEPHRINE 80 MCG/ML (10ML) SYRINGE FOR IV PUSH (FOR BLOOD PRESSURE SUPPORT)
PREFILLED_SYRINGE | INTRAVENOUS | Status: AC
  Filled 2022-07-16: qty 10

## 2022-07-16 MED ORDER — SODIUM CHLORIDE 0.9 % IV SOLN
2.0000 g | INTRAVENOUS | Status: AC
  Administered 2022-07-16: 2 g via INTRAVENOUS
  Filled 2022-07-16: qty 2

## 2022-07-16 MED ORDER — AMLODIPINE BESYLATE 5 MG PO TABS
5.0000 mg | ORAL_TABLET | Freq: Every day | ORAL | Status: DC
  Administered 2022-07-17: 5 mg via ORAL
  Filled 2022-07-16: qty 1

## 2022-07-16 MED ORDER — SODIUM CHLORIDE (PF) 0.9 % IJ SOLN
INTRAMUSCULAR | Status: AC
  Filled 2022-07-16: qty 50

## 2022-07-16 MED ORDER — SCOPOLAMINE 1 MG/3DAYS TD PT72
1.0000 | MEDICATED_PATCH | TRANSDERMAL | Status: DC
  Administered 2022-07-16: 1.5 mg via TRANSDERMAL
  Filled 2022-07-16: qty 1

## 2022-07-16 MED ORDER — HYDROCHLOROTHIAZIDE 25 MG PO TABS
25.0000 mg | ORAL_TABLET | Freq: Every day | ORAL | Status: DC
  Administered 2022-07-17: 25 mg via ORAL
  Filled 2022-07-16: qty 1

## 2022-07-16 MED ORDER — HYDRALAZINE HCL 20 MG/ML IJ SOLN: 10.0000 mg | INTRAMUSCULAR | Status: DC | PRN

## 2022-07-16 MED ORDER — KETAMINE HCL 10 MG/ML IJ SOLN
INTRAMUSCULAR | Status: AC
  Filled 2022-07-16: qty 1

## 2022-07-16 MED ORDER — ACETAMINOPHEN 160 MG/5ML PO SOLN
1000.0000 mg | Freq: Three times a day (TID) | ORAL | Status: DC
  Administered 2022-07-17: 1000 mg via ORAL
  Filled 2022-07-16: qty 40.6

## 2022-07-16 MED ORDER — SODIUM CHLORIDE (PF) 0.9 % IJ SOLN
INTRAMUSCULAR | Status: DC | PRN
  Administered 2022-07-16: 50 mL

## 2022-07-16 MED ORDER — ROCURONIUM BROMIDE 10 MG/ML (PF) SYRINGE
PREFILLED_SYRINGE | INTRAVENOUS | Status: AC
  Filled 2022-07-16: qty 10

## 2022-07-16 MED ORDER — ONDANSETRON HCL 4 MG/2ML IJ SOLN
INTRAMUSCULAR | Status: DC | PRN
  Administered 2022-07-16: 4 mg via INTRAVENOUS

## 2022-07-16 MED ORDER — 0.9 % SODIUM CHLORIDE (POUR BTL) OPTIME
TOPICAL | Status: DC | PRN
  Administered 2022-07-16: 1000 mL

## 2022-07-16 MED ORDER — ORAL CARE MOUTH RINSE: 15.0000 mL | Freq: Once | OROMUCOSAL | Status: AC

## 2022-07-16 MED ORDER — LACTATED RINGERS IV SOLN: INTRAVENOUS | Status: DC

## 2022-07-16 MED ORDER — PHENYLEPHRINE HCL (PRESSORS) 10 MG/ML IV SOLN
INTRAVENOUS | Status: DC | PRN
  Administered 2022-07-16: 80 ug via INTRAVENOUS
  Administered 2022-07-16 (×3): 160 ug via INTRAVENOUS

## 2022-07-16 MED ORDER — CHLORHEXIDINE GLUCONATE 4 % EX LIQD: Freq: Once | CUTANEOUS | Status: DC

## 2022-07-16 MED ORDER — BUPIVACAINE LIPOSOME 1.3 % IJ SUSP: 20.0000 mL | Freq: Once | INTRAMUSCULAR | Status: DC

## 2022-07-16 MED ORDER — ACETAMINOPHEN 500 MG PO TABS
1000.0000 mg | ORAL_TABLET | ORAL | Status: AC
  Administered 2022-07-16: 1000 mg via ORAL
  Filled 2022-07-16: qty 2

## 2022-07-16 MED ORDER — PANTOPRAZOLE SODIUM 40 MG IV SOLR
40.0000 mg | Freq: Every day | INTRAVENOUS | Status: DC
  Administered 2022-07-16: 40 mg via INTRAVENOUS
  Filled 2022-07-16: qty 10

## 2022-07-16 MED ORDER — APREPITANT 40 MG PO CAPS
40.0000 mg | ORAL_CAPSULE | ORAL | Status: AC
  Administered 2022-07-16: 40 mg via ORAL
  Filled 2022-07-16: qty 1

## 2022-07-16 MED ORDER — LACTATED RINGERS IR SOLN
Status: DC | PRN
  Administered 2022-07-16: 1000 mL

## 2022-07-16 MED ORDER — OXYCODONE HCL 5 MG/5ML PO SOLN
5.0000 mg | Freq: Four times a day (QID) | ORAL | Status: DC | PRN
  Administered 2022-07-16 – 2022-07-17 (×3): 5 mg via ORAL
  Filled 2022-07-16 (×3): qty 5

## 2022-07-16 MED ORDER — ROCURONIUM BROMIDE 100 MG/10ML IV SOLN
INTRAVENOUS | Status: DC | PRN
  Administered 2022-07-16: 100 mg via INTRAVENOUS

## 2022-07-16 MED ORDER — SUGAMMADEX SODIUM 500 MG/5ML IV SOLN
INTRAVENOUS | Status: DC | PRN
  Administered 2022-07-16: 231.8 mg via INTRAVENOUS

## 2022-07-16 MED ORDER — LIDOCAINE HCL (PF) 2 % IJ SOLN
INTRAMUSCULAR | Status: AC
  Filled 2022-07-16: qty 15

## 2022-07-16 MED ORDER — SIMETHICONE 80 MG PO CHEW
80.0000 mg | CHEWABLE_TABLET | Freq: Four times a day (QID) | ORAL | Status: DC | PRN
  Administered 2022-07-16: 80 mg via ORAL
  Filled 2022-07-16: qty 1

## 2022-07-16 MED ORDER — SUGAMMADEX SODIUM 500 MG/5ML IV SOLN
INTRAVENOUS | Status: AC
  Filled 2022-07-16: qty 5

## 2022-07-16 MED ORDER — ACETAMINOPHEN 500 MG PO TABS
1000.0000 mg | ORAL_TABLET | Freq: Three times a day (TID) | ORAL | Status: DC
  Administered 2022-07-16 – 2022-07-17 (×2): 1000 mg via ORAL
  Filled 2022-07-16 (×2): qty 2

## 2022-07-16 MED ORDER — MIDAZOLAM HCL 2 MG/2ML IJ SOLN
INTRAMUSCULAR | Status: AC
  Filled 2022-07-16: qty 2

## 2022-07-16 MED ORDER — AMISULPRIDE (ANTIEMETIC) 5 MG/2ML IV SOLN: 10.0000 mg | Freq: Once | INTRAVENOUS | Status: DC | PRN

## 2022-07-16 MED ORDER — INSULIN ASPART 100 UNIT/ML IJ SOLN
0.0000 [IU] | INTRAMUSCULAR | Status: DC
  Administered 2022-07-16 (×2): 3 [IU] via SUBCUTANEOUS
  Administered 2022-07-17: 2 [IU] via SUBCUTANEOUS
  Administered 2022-07-17: 3 [IU] via SUBCUTANEOUS
  Administered 2022-07-17: 2 [IU] via SUBCUTANEOUS
  Administered 2022-07-17: 3 [IU] via SUBCUTANEOUS

## 2022-07-16 MED ORDER — FENTANYL CITRATE (PF) 250 MCG/5ML IJ SOLN
INTRAMUSCULAR | Status: AC
  Filled 2022-07-16: qty 5

## 2022-07-16 MED ORDER — DEXAMETHASONE SODIUM PHOSPHATE 4 MG/ML IJ SOLN: 4.0000 mg | INTRAMUSCULAR | Status: DC

## 2022-07-16 MED ORDER — KETAMINE HCL 10 MG/ML IJ SOLN
INTRAMUSCULAR | Status: DC | PRN
  Administered 2022-07-16 (×2): 20 mg via INTRAVENOUS
  Administered 2022-07-16: 10 mg via INTRAVENOUS

## 2022-07-16 MED ORDER — VALSARTAN-HYDROCHLOROTHIAZIDE 160-25 MG PO TABS: 1.0000 | ORAL_TABLET | Freq: Every day | ORAL | Status: DC

## 2022-07-16 MED ORDER — PROPOFOL 10 MG/ML IV BOLUS
INTRAVENOUS | Status: AC
  Filled 2022-07-16: qty 20

## 2022-07-16 MED ORDER — IRBESARTAN 150 MG PO TABS
150.0000 mg | ORAL_TABLET | Freq: Every day | ORAL | Status: DC
  Administered 2022-07-17: 150 mg via ORAL
  Filled 2022-07-16: qty 1

## 2022-07-16 MED ORDER — ACETAMINOPHEN 10 MG/ML IV SOLN: 1000.0000 mg | Freq: Once | INTRAVENOUS | Status: DC | PRN

## 2022-07-16 MED ORDER — HYDROMORPHONE HCL 1 MG/ML IJ SOLN
INTRAMUSCULAR | Status: AC
  Filled 2022-07-16: qty 1

## 2022-07-16 MED ORDER — PROCHLORPERAZINE EDISYLATE 10 MG/2ML IJ SOLN: 10.0000 mg | Freq: Four times a day (QID) | INTRAMUSCULAR | Status: DC | PRN

## 2022-07-16 MED ORDER — OXYCODONE HCL 5 MG/5ML PO SOLN: 5.0000 mg | Freq: Once | ORAL | Status: DC | PRN

## 2022-07-16 MED ORDER — BUPIVACAINE LIPOSOME 1.3 % IJ SUSP
INTRAMUSCULAR | Status: AC
  Filled 2022-07-16: qty 20

## 2022-07-16 MED ORDER — ONDANSETRON HCL 4 MG/2ML IJ SOLN: 4.0000 mg | Freq: Once | INTRAMUSCULAR | Status: DC | PRN

## 2022-07-16 MED ORDER — LIDOCAINE HCL (PF) 2 % IJ SOLN
INTRAMUSCULAR | Status: DC | PRN
  Administered 2022-07-16: 1.5 mg/kg/h via INTRADERMAL

## 2022-07-16 MED ORDER — DEXAMETHASONE SODIUM PHOSPHATE 10 MG/ML IJ SOLN
INTRAMUSCULAR | Status: DC | PRN
  Administered 2022-07-16: 10 mg via INTRAVENOUS

## 2022-07-16 MED ORDER — ENOXAPARIN SODIUM 30 MG/0.3ML IJ SOSY
30.0000 mg | PREFILLED_SYRINGE | Freq: Two times a day (BID) | INTRAMUSCULAR | Status: DC
Start: 2022-07-16 — End: 2022-07-17
  Administered 2022-07-16 – 2022-07-17 (×2): 30 mg via SUBCUTANEOUS
  Filled 2022-07-16 (×2): qty 0.3

## 2022-07-16 MED ORDER — BUPIVACAINE LIPOSOME 1.3 % IJ SUSP
INTRAMUSCULAR | Status: DC | PRN
  Administered 2022-07-16: 20 mL

## 2022-07-16 MED ORDER — OXYCODONE HCL 5 MG PO TABS: 5.0000 mg | ORAL_TABLET | Freq: Once | ORAL | Status: DC | PRN

## 2022-07-16 MED ORDER — MIDAZOLAM HCL 5 MG/5ML IJ SOLN
INTRAMUSCULAR | Status: DC | PRN
  Administered 2022-07-16: 2 mg via INTRAVENOUS

## 2022-07-16 MED ORDER — MORPHINE SULFATE (PF) 2 MG/ML IV SOLN: 1.0000 mg | INTRAVENOUS | Status: DC | PRN

## 2022-07-16 MED ORDER — DEXAMETHASONE SODIUM PHOSPHATE 10 MG/ML IJ SOLN
INTRAMUSCULAR | Status: AC
  Filled 2022-07-16: qty 1

## 2022-07-16 MED ORDER — FENTANYL CITRATE (PF) 100 MCG/2ML IJ SOLN
INTRAMUSCULAR | Status: DC | PRN
  Administered 2022-07-16: 100 ug via INTRAVENOUS

## 2022-07-16 MED ORDER — ENSURE MAX PROTEIN PO LIQD
2.0000 [oz_av] | ORAL | Status: DC
Start: 2022-07-17 — End: 2022-07-17
  Administered 2022-07-17 (×4): 2 [oz_av] via ORAL

## 2022-07-16 MED ORDER — ONDANSETRON HCL 4 MG/2ML IJ SOLN
INTRAMUSCULAR | Status: AC
  Filled 2022-07-16: qty 2

## 2022-07-16 MED ORDER — HEPARIN SODIUM (PORCINE) 5000 UNIT/ML IJ SOLN
5000.0000 [IU] | INTRAMUSCULAR | Status: AC
  Administered 2022-07-16: 5000 [IU] via SUBCUTANEOUS
  Filled 2022-07-16: qty 1

## 2022-07-16 MED ORDER — PROPOFOL 10 MG/ML IV BOLUS
INTRAVENOUS | Status: DC | PRN
  Administered 2022-07-16: 30 mg via INTRAVENOUS
  Administered 2022-07-16: 150 mg via INTRAVENOUS

## 2022-07-16 MED ORDER — ONDANSETRON HCL 4 MG/2ML IJ SOLN: 4.0000 mg | Freq: Four times a day (QID) | INTRAMUSCULAR | Status: DC | PRN

## 2022-07-16 MED ORDER — HYDROMORPHONE HCL 1 MG/ML IJ SOLN
0.2500 mg | INTRAMUSCULAR | Status: DC | PRN
  Administered 2022-07-16 (×2): 0.5 mg via INTRAVENOUS

## 2022-07-16 MED ORDER — STERILE WATER FOR IRRIGATION IR SOLN
Status: DC | PRN
  Administered 2022-07-16: 1000 mL

## 2022-07-16 MED ORDER — LIDOCAINE HCL (CARDIAC) PF 100 MG/5ML IV SOSY
PREFILLED_SYRINGE | INTRAVENOUS | Status: DC | PRN
  Administered 2022-07-16: 100 mg via INTRAVENOUS

## 2022-07-16 MED ORDER — POTASSIUM CHLORIDE IN NACL 20-0.45 MEQ/L-% IV SOLN
INTRAVENOUS | Status: DC
  Filled 2022-07-16 (×4): qty 1000

## 2022-07-16 SURGICAL SUPPLY — 105 items
APPLICATOR COTTON TIP 6 STRL (MISCELLANEOUS) ×1 IMPLANT
APPLICATOR COTTON TIP 6IN STRL (MISCELLANEOUS) ×2
APPLIER CLIP ROT 10 11.4 M/L (STAPLE)
APPLIER CLIP ROT 13.4 12 LRG (CLIP) ×2
BAG COUNTER SPONGE SURGICOUNT (BAG) IMPLANT
BLADE EXTENDED COATED 6.5IN (ELECTRODE) IMPLANT
BLADE HEX COATED 2.75 (ELECTRODE) ×2 IMPLANT
BLADE SURG SZ11 CARB STEEL (BLADE) ×2 IMPLANT
CABLE HIGH FREQUENCY MONO STRZ (ELECTRODE) IMPLANT
CHLORAPREP W/TINT 26 (MISCELLANEOUS) ×4 IMPLANT
CLIP APPLIE ROT 10 11.4 M/L (STAPLE) IMPLANT
CLIP APPLIE ROT 13.4 12 LRG (CLIP) IMPLANT
COVER MAYO STAND STRL (DRAPES) IMPLANT
COVER SURGICAL LIGHT HANDLE (MISCELLANEOUS) ×2 IMPLANT
DEVICE SUT QUICK LOAD TK 5 (SUTURE) ×1 IMPLANT
DEVICE SUT TI-KNOT TK 5X26 (SUTURE) ×1 IMPLANT
DEVICE SUTURE ENDOST 10MM (ENDOMECHANICALS) ×1 IMPLANT
DISSECTOR BLUNT TIP ENDO 5MM (MISCELLANEOUS) IMPLANT
DRAIN PENROSE 0.5X18 (DRAIN) ×2 IMPLANT
DRAPE LAPAROSCOPIC ABDOMINAL (DRAPES) ×2 IMPLANT
DRAPE UTILITY XL STRL (DRAPES) ×4 IMPLANT
DRAPE WARM FLUID 44X44 (DRAPES) IMPLANT
DRSG TEGADERM 2-3/8X2-3/4 SM (GAUZE/BANDAGES/DRESSINGS) ×12 IMPLANT
ELECT L-HOOK LAP 45CM DISP (ELECTROSURGICAL)
ELECT REM PT RETURN 15FT ADLT (MISCELLANEOUS) ×2 IMPLANT
ELECTRODE L-HOOK LAP 45CM DISP (ELECTROSURGICAL) IMPLANT
GAUZE SPONGE 2X2 8PLY STRL LF (GAUZE/BANDAGES/DRESSINGS) IMPLANT
GAUZE SPONGE 4X4 12PLY STRL (GAUZE/BANDAGES/DRESSINGS) ×2 IMPLANT
GLOVE BIO SURGEON STRL SZ7.5 (GLOVE) ×2 IMPLANT
GLOVE BIOGEL PI IND STRL 7.0 (GLOVE) ×1 IMPLANT
GLOVE BIOGEL PI IND STRL 8 (GLOVE) ×2 IMPLANT
GLOVE BIOGEL PI INDICATOR 7.0 (GLOVE) ×1
GLOVE BIOGEL PI INDICATOR 8 (GLOVE) ×2
GLOVE BIOGEL PI ORTHO PRO 7.5 (GLOVE) ×2
GLOVE ECLIPSE 8.0 STRL XLNG CF (GLOVE) ×2 IMPLANT
GLOVE PI ORTHO PRO STRL 7.5 (GLOVE) ×2 IMPLANT
GLOVE SURG LX 7.5 STRW (GLOVE) ×1
GLOVE SURG LX STRL 7.5 STRW (GLOVE) ×1 IMPLANT
GOWN STRL REUS W/ TWL XL LVL3 (GOWN DISPOSABLE) ×3 IMPLANT
GOWN STRL REUS W/TWL XL LVL3 (GOWN DISPOSABLE) ×3
GRASPER SUT TROCAR 14GX15 (MISCELLANEOUS) ×2 IMPLANT
HANDLE SUCTION POOLE (INSTRUMENTS) IMPLANT
IRRIG SUCT STRYKERFLOW 2 WTIP (MISCELLANEOUS) ×2
IRRIGATION SUCT STRKRFLW 2 WTP (MISCELLANEOUS) ×1 IMPLANT
KIT BASIN OR (CUSTOM PROCEDURE TRAY) ×2 IMPLANT
KIT TURNOVER KIT A (KITS) IMPLANT
MARKER SKIN DUAL TIP RULER LAB (MISCELLANEOUS) ×2 IMPLANT
MAT PREVALON FULL STRYKER (MISCELLANEOUS) ×2 IMPLANT
NDL SPNL 22GX3.5 QUINCKE BK (NEEDLE) ×1 IMPLANT
NEEDLE SPNL 22GX3.5 QUINCKE BK (NEEDLE) ×2 IMPLANT
NS IRRIG 1000ML POUR BTL (IV SOLUTION) ×2 IMPLANT
PACK GENERAL/GYN (CUSTOM PROCEDURE TRAY) ×2 IMPLANT
PACK UNIVERSAL I (CUSTOM PROCEDURE TRAY) ×2 IMPLANT
PENCIL SMOKE EVACUATOR (MISCELLANEOUS) IMPLANT
RELOAD STAPLE 60 3.6 BLU REG (STAPLE) ×1 IMPLANT
RELOAD STAPLE 60 3.8 GOLD REG (STAPLE) IMPLANT
RELOAD STAPLE 60 4.1 GRN THCK (STAPLE) ×1 IMPLANT
RELOAD STAPLE 60 BLK VRY/THCK (STAPLE) IMPLANT
RELOAD STAPLER 60MM BLK (STAPLE) IMPLANT
RELOAD STAPLER BLUE 60MM (STAPLE) ×6 IMPLANT
RELOAD STAPLER GOLD 60MM (STAPLE) IMPLANT
RELOAD STAPLER GREEN 60MM (STAPLE) ×2 IMPLANT
SCISSORS LAP 5X45 EPIX DISP (ENDOMECHANICALS) IMPLANT
SEALANT SURGICAL APPL DUAL CAN (MISCELLANEOUS) IMPLANT
SET TUBE SMOKE EVAC HIGH FLOW (TUBING) ×2 IMPLANT
SHEARS HARMONIC ACE PLUS 45CM (MISCELLANEOUS) ×2 IMPLANT
SLEEVE ADV FIXATION 5X100MM (TROCAR) ×4 IMPLANT
SLEEVE GASTRECTOMY 40FR VISIGI (MISCELLANEOUS) ×2 IMPLANT
SOL ANTI FOG 6CC (MISCELLANEOUS) ×1 IMPLANT
SOLUTION ANTI FOG 6CC (MISCELLANEOUS) ×1
SPIKE FLUID TRANSFER (MISCELLANEOUS) ×2 IMPLANT
SPONGE GAUZE 2X2 STER 10/PKG (GAUZE/BANDAGES/DRESSINGS)
STAPLE LINE REINFORCEMENT LAP (STAPLE) ×3 IMPLANT
STAPLER ECHELON BIOABSB 60 FLE (MISCELLANEOUS) IMPLANT
STAPLER ECHELON LONG 60 440 (INSTRUMENTS) ×2 IMPLANT
STAPLER RELOAD 60MM BLK (STAPLE)
STAPLER RELOAD BLUE 60MM (STAPLE) ×12
STAPLER RELOAD GOLD 60MM (STAPLE)
STAPLER RELOAD GREEN 60MM (STAPLE) ×4
STAPLER VISISTAT 35W (STAPLE) ×2 IMPLANT
STRIP CLOSURE SKIN 1/2X4 (GAUZE/BANDAGES/DRESSINGS) ×2 IMPLANT
SUCTION POOLE HANDLE (INSTRUMENTS)
SUT MNCRL AB 4-0 PS2 18 (SUTURE) ×2 IMPLANT
SUT SILK 2 0 (SUTURE)
SUT SILK 2 0 SH CR/8 (SUTURE) IMPLANT
SUT SILK 2-0 18XBRD TIE 12 (SUTURE) IMPLANT
SUT SILK 3 0 (SUTURE)
SUT SILK 3 0 SH CR/8 (SUTURE) IMPLANT
SUT SILK 3-0 18XBRD TIE 12 (SUTURE) IMPLANT
SUT SURGIDAC NAB ES-9 0 48 120 (SUTURE) ×1 IMPLANT
SUT VICRYL 0 TIES 12 18 (SUTURE) ×2 IMPLANT
SUT VICRYL 2 0 18  UND BR (SUTURE)
SUT VICRYL 2 0 18 UND BR (SUTURE) IMPLANT
SYR 20ML LL LF (SYRINGE) ×2 IMPLANT
SYR 50ML LL SCALE MARK (SYRINGE) ×2 IMPLANT
SYS KII OPTICAL ACCESS 15MM (TROCAR) ×2
SYSTEM KII OPTICAL ACCESS 15MM (TROCAR) ×1 IMPLANT
TOWEL OR 17X26 10 PK STRL BLUE (TOWEL DISPOSABLE) ×4 IMPLANT
TOWEL OR NON WOVEN STRL DISP B (DISPOSABLE) ×2 IMPLANT
TRAY FOLEY MTR SLVR 16FR STAT (SET/KITS/TRAYS/PACK) IMPLANT
TROCAR ADV FIXATION 5X100MM (TROCAR) ×2 IMPLANT
TROCAR Z-THREAD OPTICAL 5X100M (TROCAR) ×2 IMPLANT
TUBING CONNECTING 10 (TUBING) ×4 IMPLANT
TUBING ENDO SMARTCAP (MISCELLANEOUS) ×2 IMPLANT
YANKAUER SUCT BULB TIP NO VENT (SUCTIONS) IMPLANT

## 2022-07-16 NOTE — Discharge Instructions (Signed)

## 2022-07-16 NOTE — Progress Notes (Signed)

## 2022-07-16 NOTE — Op Note (Signed)
07/16/2022 Alexis Mathews 01-20-1986 [id number]   PRE-OPERATIVE DIAGNOSIS:   Severe obesity (BMI 47)  Primary hypertension  Diabetes mellitus type 2 in obese (CMS-HCC)  Low HDL (under 40)  Hyperlipidemia associated with type 2 diabetes mellitus (CMS-HCC)  Vitamin D deficiency  Sliding hiatal hernia  POST-OPERATIVE DIAGNOSIS:  same  PROCEDURE:  Procedure(s): LAPAROSCOPIC SLEEVE GASTRECTOMY WITH SLIDING HIATAL HERNIA REPAIR UPPER GI ENDOSCOPY  SURGEON:  Surgeon(s): Atilano Ina, MD FACS FASMBS  ASSISTANTS: Phylliss Blakes MD FACS  ANESTHESIA:   general  DRAINS: none   BOUGIE: 40 fr ViSiGi  LOCAL MEDICATIONS USED:   Exparel  EBL: minimal  SPECIMEN:  Source of Specimen:  Greater curvature of stomach  DISPOSITION OF SPECIMEN:  PATHOLOGY  COUNTS:  YES  INDICATION FOR PROCEDURE: This is a very pleasant 36 y.o.-year-old morbidly obese female who has had unsuccessful attempts for sustained weight loss. The patient presents today for a planned laparoscopic sleeve gastrectomy with upper endoscopy. We have discussed the risk and benefits of the procedure extensively preoperatively. Please see my separate notes.  PROCEDURE: After obtaining informed consent and receiving 5000 units of subcutaneous heparin, the patient was brought to the operating room at [facility] and placed supine on the operating room table. General endotracheal anesthesia was established. Sequential compression devices were placed. A orogastric tube was placed. The patient's abdomen was prepped and draped in the usual standard surgical fashion. The patient received preoperative IV antibiotic. A surgical timeout was performed. ERAS protocol used.   Access to the abdomen was achieved using a 5 mm 0 laparoscope thru a 5 mm trocar In the left upper Quadrant 2 fingerbreadths below the left subcostal margin using the Optiview technique. Pneumoperitoneum was smoothly established up to 15 mm of mercury. The  laparoscope was advanced and the abdominal cavity was surveilled. The patient was then placed in reverse Trendelenburg.   A 5 mm trocar was placed slightly above and to the left of the umbilicus under direct visualization.  The [facility] liver retractor was placed under the left lobe of the liver through a 5 mm trocar incision site in the subxiphoid position. A 5 mm trocar was placed in the lateral right upper quadrant along with a 15 mm trocar in the mid right abdomen. A final 5 mm trocar was placed in the lateral LUQ.  All under direct visualization after exparel had been infiltrated in bilateral lateral upper abdominal walls as a TAP block for postoperative pain relief.  The stomach was inspected. It was completely decompressed and the orogastric tube was removed.  There is a small anterior dimple that was obviously visible. Preop upper gi showed small sliding hiatal hernia. The gastrohepatic ligament was incised with harmonic scalpel. The right crus was identified. We identified the crossing fat along the right crus. The adipose tissue just above this area was incised with harmonic scalpel. I then bluntly dissected out this area and identified the left crus. There was evidence of a hiatal hernia. I then mobilized the esophagus. The left and right crus were further mobilized with blunt dissection. I was then able to reapproximate the left and right crus with 0 Ethibond using an Endostitch suture device and securing it with a titanium tyknot.GE junction well below diaphragm.   We identified the pylorus and measured 6 cm proximal to the pylorus and identified an area of where we would start taking down the short gastric vessels. Harmonic scalpel was used to take down the short gastric vessels along the greater  curvature of the stomach. We were able to enter the lesser sac. We continued to march along the greater curvature of the stomach taking down the short gastrics. As we approached the gastrosplenic  ligament we took care in this area not to injure the spleen. We were able to take down the entire gastrosplenic ligament. We then mobilized the fundus away from the left crus of diaphragm. There were not any significant posterior gastric avascular attachments. This left the stomach completely mobilized. No vessels had been taken down along the lesser curvature of the stomach.  We then reidentified the pylorus. A 40Fr ViSiGi was then placed in the oropharynx and advanced down into the stomach and placed in the distal antrum and positioned along the lesser curvature. It was placed under suction which secured the 40Fr ViSiGi in place along the lesser curve. Then using the Ethicon echelon 60 mm stapler with a green load with ethicon staple line reinforcement (ESLR), I placed a stapler along the antrum approximately 5 cm from the pylorus. The stapler was angled so that there is ample room at the angularis incisura. I then fired the first staple load after inspecting it posteriorly to ensure adequate space both anteriorly and posteriorly. At this point I still was not completely past the angularis so with a blue load with ESLR, I placed the stapler in position just inside the prior stapleline. We then rotated the stomach to insure that there was adequate anteriorly as well as posteriorly. The stapler was then fired.  At this point I continued using 60 mm blue load staple cartridges with ESLR. The echelon stapler was then repositioned with a 60 mm blue load with ESLR and we continued to march up along the ViSiGi. My assistant was holding traction along the greater curvature stomach along the cauterized short gastric vessels ensuring that the stomach was symmetrically retracted. Prior to each firing of the staple, we rotated the stomach to ensure that there is adequate stomach left.  As we approached the fundus, I used 60 mm blue cartridge with ESLR aiming  lateral to the GE junction after mobilizing some of the  esophageal fat pad.  The sleeve was inspected. There is no evidence of cork screw. The staple line appeared hemostatic. The CRNA inflated the ViSiGi to the green zone and the upper abdomen was flooded with saline. There were no bubbles. The sleeve was decompressed and the ViSiGi removed. My assistant scrubbed out and performed an upper endoscopy. The sleeve easily distended with air and the scope was easily advanced to the pylorus. There is no evidence of internal bleeding or cork screwing. There was no narrowing at the angularis. There is no evidence of bubbles. Please see her operative note for further details. The gastric sleeve was decompressed and the endoscope was removed.  The greater curvature the stomach was grasped with a laparoscopic grasper and removed from the 15 mm trocar site.  The liver retractor was removed. I then closed the 15 mm trocar site with 1 interrupted 0 Vicryl sutures through the fascia using the endoclose. The closure was viewed laparoscopically and it was airtight. Remaining Exparel was then infiltrated in the preperitoneal spaces around the trocar sites. Pneumoperitoneum was released. All trocar sites were closed with a 4-0 Monocryl in a subcuticular fashion followed by the application of steri-strips, and bandaids. The patient was extubated and taken to the recovery room in stable condition. All needle, instrument, and sponge counts were correct x2. There are no immediate complications  (  1) 60 mm green with ESLR (0) 60 mm gold with ESLR (6) 60 mm blue with 5 ESLR  PLAN OF CARE: Admit to inpatient   PATIENT DISPOSITION:  PACU - hemodynamically stable.   Delay start of Pharmacological VTE agent (>24hrs) due to surgical blood loss or risk of bleeding:  no  [name]. Andrey Campanile, MD, FACS FASMBS General, Bariatric, & Minimally Invasive Surgery [facility], [place]

## 2022-07-16 NOTE — Anesthesia Procedure Notes (Signed)
Procedure Name: Intubation Date/Time: 07/16/2022 10:56 AM  Performed by: Shanon Payor, CRNAPre-anesthesia Checklist: Patient identified, Emergency Drugs available, Suction available, Patient being monitored and Timeout performed Patient Re-evaluated:Patient Re-evaluated prior to induction Oxygen Delivery Method: Circle system utilized Preoxygenation: Pre-oxygenation with 100% oxygen Induction Type: IV induction Ventilation: Oral airway inserted - appropriate to patient size Laryngoscope Size: Glidescope and 3 Grade View: Grade I Tube type: Oral Tube size: 7.0 mm Number of attempts: 2 Airway Equipment and Method: Stylet and Video-laryngoscopy Placement Confirmation: ETT inserted through vocal cords under direct vision, positive ETCO2, CO2 detector and breath sounds checked- equal and bilateral Secured at: 22 cm Tube secured with: Tape Dental Injury: Teeth and Oropharynx as per pre-operative assessment  Comments: DL by Graylin Shiver CRNA, unable to see cords. DL by Dr. Richardson Landry with glidescope, easy, atraumatic intubation with glidescope. VSS throughout induction

## 2022-07-16 NOTE — H&P (Signed)
PROVIDER: Charmaine Placido Sherril Cong, MD  MRN: [id number] DOB: 02-Nov-1986 DATE OF ENCOUNTER: 07/05/2022 Subjective   Chief Complaint: Follow-up (Pre op lsg)   History of Present Illness: Alexis Mathews is a 36 y.o. female who is seen today for long term f/u regarding her severe obesity and related co-morbidities.   Her comorbidities include hypertension, hyperlipidemia, diabetes mellitus type 2, central low back pain   He denies any medical changes since I saw her in February. No chest pain, chest pressure, shortness of breath, dyspnea exertion. No tobacco use. No abdominal pain. She states that she has been actively working on improving her food choices and cutting back on fast food as well as eliminating regular sodas. As result she has lost about 30 pounds total. She also reports her hemoglobin A1c is improved. She just had it checked the other day and it was down to 5.9. She denies any trips to the emergency room or hospital.  She has received nutritional and psychological evaluation and clearance for surgery.  Review of Systems: A complete review of systems was obtained from the patient. I have reviewed this information and discussed as appropriate with the patient. See HPI as well for other ROS.  ROS   Medical History: Past Medical History:  Diagnosis Date  Diabetes mellitus without complication (CMS-HCC)  Hyperlipidemia  Hypertension   Patient Active Problem List  Diagnosis  DM (diabetes mellitus), type 2 (CMS-HCC)  Hypertension  Morbid obesity (CMS-HCC)  Hyperlipidemia associated with type 2 diabetes mellitus (CMS-HCC)   Past Surgical History:  Procedure Laterality Date  cyst removal neck  INCISION AND DRAINAGE ABSCESS ANAL    No Known Allergies  Current Outpatient Medications on File Prior to Visit  Medication Sig Dispense Refill  amLODIPine (NORVASC) 5 MG tablet  atorvastatin (LIPITOR) 40 MG tablet  metFORMIN (GLUCOPHAGE) 1000 MG tablet   No current  facility-administered medications on file prior to visit.   History reviewed. No pertinent family history.   Social History   Tobacco Use  Smoking Status Never  Smokeless Tobacco Never    Social History   Socioeconomic History  Marital status: Single  Tobacco Use  Smoking status: Never  Smokeless tobacco: Never  Vaping Use  Vaping Use: Never used  Substance and Sexual Activity  Alcohol use: Defer  Drug use: Defer   Objective:   Vitals:  07/05/22 0842  BP: 132/78  Pulse: 70  Resp: 18  Temp: 36.8 C (98.2 F)  SpO2: 96%  Weight: (!) 114.9 kg (253 lb 3.2 oz)  Height: 158.8 cm (5' 2.52")   Body mass index is 45.54 kg/m.  Gen: alert, NAD, non-toxic appearing Pupils: equal, no scleral icterus Pulm: Lungs clear to auscultation, symmetric chest rise CV: regular rate and rhythm Abd: soft, nontender, nondistended. No cellulitis. No incisional hernia Ext: no edema,  Skin: no rash, no jaundice  Labs, Imaging and Diagnostic Testing: Labs from July 18th show an improved A1c down to 5.9 pcp note 04/01/22 labs 03/2022 - lipids improved ldl 63, tc 115, hdl 31.7; cbc wbc 12.8, hgb 12.8, plt 463; cmet ok, vit d 18; a1c 6.2 03/05/22 cxr ok IMPRESSION: 1. Small hiatal hernia. 2. Otherwise normal anatomy of the esophagus, stomach and duodenum. 3. Mild gastroesophageal reflux.  Assessment and Plan:  Diagnoses and all orders for this visit:  Severe obesity (CMS-HCC)  Primary hypertension  Diabetes mellitus type 2 in obese (CMS-HCC)  Low HDL (under 40)  Hyperlipidemia associated with type 2 diabetes mellitus (CMS-HCC)  Vitamin D deficiency  Sliding hiatal hernia    We reviewed her preoperative evaluation. She has received insurance authorization for sleeve gastrectomy. We did discuss the findings of a small sliding hiatal hernia on her upper GI. I discussed hiatal hernia. We discussed the steps to identify during surgery as well as surgical correction. I recommended  that we corrected in order to try to minimize long-term reflux but did remind the patient of the possibility of reflux after sleeve gastrectomy. We rediscussed the typical perioperative process. She has attended her preoperative education class. We rediscussed signs and symptoms of a leak. We discussed the typical things that we see in the first couple weeks after surgery. I congratulated her on her weight loss. We rediscussed the preoperative meal plan. We also rediscussed typical hospitalization. She signed the surgical consent form today  This patient encounter took 30 minutes today to perform the following: take history, perform exam, review outside records, interpret imaging, counsel the patient on their diagnosis and document encounter, findings & plan in the EHR  No follow-ups on file.  [name]. Andrey Campanile MD FACS General, Minimally Invasive, & Bariatric Surgery Electronically signed by Gara Kroner, MD at 07/05/2022 9:00 AM EDT

## 2022-07-16 NOTE — Progress Notes (Signed)
PHARMACY CONSULT FOR:  Risk Assessment for Post-Discharge VTE Following Bariatric Surgery  Post-Discharge VTE Risk Assessment: This patient's probability of 30-day post-discharge VTE is increased due to the factors marked: x Sleeve gastrectomy   Liver disorder (transplant, cirrhosis, or nonalcoholic steatohepatitis)   Hx of VTE   Hemorrhage requiring transfusion   GI perforation, leak, or obstruction   ====================================================    Female    Age >/=60 years    BMI >/=50 kg/m2    CHF    Dyspnea at Rest    Paraplegia   x Non-gastric-band surgery    Operation Time >/=3 hr    Return to OR     Length of Stay >/= 3 d   Hypercoagulable condition   Significant venous stasis    Predicted probability of 30-day post-discharge VTE: 0.16% mild  Recommendation for Discharge: No pharmacologic prophylaxis post-discharge  Alexis Mathews is a 36 y.o. female who underwent  8/1: laparoscopic sleeve gastrectomy   Case start: 1112   Case end: 1217   No Known Allergies  Patient Measurements: Height: 5\' 2"  (157.5 cm) Weight: 115.9 kg (255 lb 9.6 oz) IBW/kg (Calculated) : 50.1 Body mass index is 46.75 kg/m.  No results for input(s): "WBC", "HGB", "HCT", "PLT", "APTT", "CREATININE", "LABCREA", "CREAT24HRUR", "MG", "PHOS", "ALBUMIN", "PROT", "AST", "ALT", "ALKPHOS", "BILITOT", "BILIDIR", "IBILI" in the last 72 hours. Estimated Creatinine Clearance: 116.1 mL/min (by C-G formula based on SCr of 0.49 mg/dL).    Past Medical History:  Diagnosis Date   Abscess    neck   Anxiety    Diabetes mellitus without complication (HCC)    Hidradenitis 10/26/2020   posterior neck; self reported history axillary abscesses.   Hypertension     Owens Hara S. 13/10/2020, PharmD, BCPS Clinical Staff Pharmacist [email or web address]  Merilynn Finland, Edee Nifong Stillinger 07/16/2022,12:40 PM

## 2022-07-16 NOTE — Op Note (Signed)
Preoperative diagnosis: laparoscopic sleeve gastrectomy  Postoperative diagnosis: Same   Procedure: Upper endoscopy   Surgeon: Alexis Mathews, M.D.  Anesthesia: Gen.   Description of procedure: The endoscope was placed in the mouth and oropharynx and under endoscopic vision it was advanced to the esophagogastric junction which was identified at 36cm from the teeth.  The pouch was tensely insufflated while the upper abdomen was flooded with irrigation to perform a leak test, which was negative. No bubbles were seen.  The staple line was hemostatic and the lumen was evenly tubular without undue narrowing, angulation or twisting specifically at the incisura angularis. The lumen was decompressed and the scope was withdrawn without difficulty.    Alexis Mathews, M.D. General, Bariatric, & Minimally Invasive Surgery [facility]    

## 2022-07-16 NOTE — Interval H&P Note (Signed)
History and Physical Interval Note:  07/16/2022 10:09 AM  Juliann Pulse  has presented today for surgery, with the diagnosis of MORBID OBESITY.  The various methods of treatment have been discussed with the patient and family. After consideration of risks, benefits and other options for treatment, the patient has consented to  Procedure(s): LAPAROSCOPIC SLEEVE GASTRECTOMY (N/A) UPPER GI ENDOSCOPY (N/A) HERNIA REPAIR HIATAL (N/A) as a surgical intervention.  The patient's history has been reviewed, patient examined, no change in status, stable for surgery.  I have reviewed the patient's chart and labs.  Questions were answered to the patient's satisfaction.     Gaynelle Adu

## 2022-07-16 NOTE — Anesthesia Postprocedure Evaluation (Signed)
Anesthesia Post Note  Patient: Alexis Mathews  Procedure(s) Performed: LAPAROSCOPIC SLEEVE GASTRECTOMY UPPER GI ENDOSCOPY HERNIA REPAIR HIATAL     Patient location during evaluation: PACU Anesthesia Type: General Level of consciousness: awake and alert Pain management: pain level controlled Vital Signs Assessment: post-procedure vital signs reviewed and stable Respiratory status: spontaneous breathing, nonlabored ventilation, respiratory function stable and patient connected to nasal cannula oxygen Cardiovascular status: blood pressure returned to baseline and stable Postop Assessment: no apparent nausea or vomiting Anesthetic complications: no   No notable events documented.  Last Vitals:  Vitals:   07/16/22 1315 07/16/22 1343  BP: (!) 143/80 (!) 149/75  Pulse: 78 74  Resp: 14 14  Temp: 36.5 C 36.4 C  SpO2: 93% 90%    Last Pain:  Vitals:   07/16/22 1343  TempSrc: Oral  PainSc:                  [name]

## 2022-07-16 NOTE — Transfer of Care (Signed)
Immediate Anesthesia Transfer of Care Note  Patient: Alexis Mathews  Procedure(s) Performed: LAPAROSCOPIC SLEEVE GASTRECTOMY UPPER GI ENDOSCOPY HERNIA REPAIR HIATAL  Patient Location: PACU  Anesthesia Type:General  Level of Consciousness: awake, alert , oriented and drowsy  Airway & Oxygen Therapy: Patient Spontanous Breathing and Patient connected to face mask oxygen  Post-op Assessment: Report given to RN, Post -op Vital signs reviewed and stable and Patient moving all extremities X 4  Post vital signs: Reviewed and stable  Last Vitals:  Vitals Value Taken Time  BP 140/70 07/16/22 1232  Temp    Pulse 80 07/16/22 1233  Resp 14 07/16/22 1233  SpO2 97 % 07/16/22 1233  Vitals shown include unvalidated device data.  Last Pain:  Vitals:   07/16/22 0922  TempSrc:   PainSc: 0-No pain         Complications: No notable events documented.

## 2022-07-17 ENCOUNTER — Encounter (HOSPITAL_COMMUNITY): Payer: Self-pay | Admitting: General Surgery

## 2022-07-17 LAB — CBC WITH DIFFERENTIAL/PLATELET
Abs Immature Granulocytes: 0.04 10*3/uL (ref 0.00–0.07)
Basophils Absolute: 0 10*3/uL (ref 0.0–0.1)
Basophils Relative: 0 %
Eosinophils Absolute: 0 10*3/uL (ref 0.0–0.5)
Eosinophils Relative: 0 %
HCT: 39.3 % (ref 36.0–46.0)
Hemoglobin: 13.1 g/dL (ref 12.0–15.0)
Immature Granulocytes: 0 %
Lymphocytes Relative: 12 %
Lymphs Abs: 1.5 10*3/uL (ref 0.7–4.0)
MCH: 28.4 pg (ref 26.0–34.0)
MCHC: 33.3 g/dL (ref 30.0–36.0)
MCV: 85.2 fL (ref 80.0–100.0)
Monocytes Absolute: 0.4 10*3/uL (ref 0.1–1.0)
Monocytes Relative: 3 %
Neutro Abs: 10.8 10*3/uL — ABNORMAL HIGH (ref 1.7–7.7)
Neutrophils Relative %: 85 %
Platelets: 416 10*3/uL — ABNORMAL HIGH (ref 150–400)
RBC: 4.61 MIL/uL (ref 3.87–5.11)
RDW: 13.6 % (ref 11.5–15.5)
WBC: 12.7 10*3/uL — ABNORMAL HIGH (ref 4.0–10.5)
nRBC: 0 % (ref 0.0–0.2)

## 2022-07-17 LAB — COMPREHENSIVE METABOLIC PANEL
ALT: 16 U/L (ref 0–44)
AST: 17 U/L (ref 15–41)
Albumin: 3.4 g/dL — ABNORMAL LOW (ref 3.5–5.0)
Alkaline Phosphatase: 45 U/L (ref 38–126)
Anion gap: 7 (ref 5–15)
BUN: 11 mg/dL (ref 6–20)
CO2: 24 mmol/L (ref 22–32)
Calcium: 8.8 mg/dL — ABNORMAL LOW (ref 8.9–10.3)
Chloride: 105 mmol/L (ref 98–111)
Creatinine, Ser: 0.56 mg/dL (ref 0.44–1.00)
GFR, Estimated: >60 mL/min (ref >60)
Glucose, Bld: 148 mg/dL — ABNORMAL HIGH (ref 70–99)
Potassium: 3.9 mmol/L (ref 3.5–5.1)
Sodium: 136 mmol/L (ref 135–145)
Total Bilirubin: 0.4 mg/dL (ref 0.3–1.2)
Total Protein: 8.6 g/dL — ABNORMAL HIGH (ref 6.5–8.1)

## 2022-07-17 LAB — GLUCOSE, CAPILLARY
Glucose-Capillary: 153 mg/dL — ABNORMAL HIGH (ref 70–99)
Glucose-Capillary: 140 mg/dL — ABNORMAL HIGH (ref 70–99)
Glucose-Capillary: 143 mg/dL — ABNORMAL HIGH (ref 70–99)

## 2022-07-17 LAB — SURGICAL PATHOLOGY

## 2022-07-17 MED ORDER — ACETAMINOPHEN 500 MG PO TABS: 1000.0000 mg | ORAL_TABLET | Freq: Three times a day (TID) | ORAL | 0 refills | Status: AC

## 2022-07-17 MED ORDER — ONDANSETRON 4 MG PO TBDP: 4.0000 mg | ORAL_TABLET | Freq: Four times a day (QID) | ORAL | 0 refills | Status: DC | PRN

## 2022-07-17 MED ORDER — PANTOPRAZOLE SODIUM 40 MG PO TBEC: 40.0000 mg | DELAYED_RELEASE_TABLET | Freq: Every day | ORAL | 0 refills | Status: DC

## 2022-07-17 MED ORDER — OXYCODONE HCL 5 MG PO TABS: 5.0000 mg | ORAL_TABLET | Freq: Four times a day (QID) | ORAL | 0 refills | Status: DC | PRN

## 2022-07-17 NOTE — Discharge Summary (Signed)
Physician Discharge Summary  Alexis Mathews LLV:[id number] DOB: May 04, 1986 DOA: 07/16/2022  PCP: Isaac Bliss, Rayford Halsted, MD  Admit date: 07/16/2022 Discharge date: 07/17/2022  Recommendations for Outpatient Follow-up:    Follow-up Information     Maczis, Carlena Hurl, [place]. Go on 08/15/2022.   Specialty: General Surgery Why: at 9:30am for Dr. Redmond Pulling.  Please arrive 15 minutes prior to your appointment time.  Thank you. Contact information: [place] [address] [phone]         Greer Pickerel, MD. Go on 09/18/2022.   Specialty: General Surgery Why: at 9::30am.  Please arrive 15 minutes prior to your appointment time.  Thank you. Contact information: [place] [address] [phone]                Discharge Diagnoses:  Principal Problem:   S/P laparoscopic sleeve gastrectomy Severe obesity (BMI 47)  Primary hypertension  Diabetes mellitus type 2 in obese (CMS-HCC)  Low HDL (under 40)  Hyperlipidemia associated with type 2 diabetes mellitus (CMS-HCC)  Vitamin D deficiency  Sliding hiatal hernia  Surgical Procedure: Laparoscopic Sleeve Gastrectomywith sliding hiatal hernia repair, upper endoscopy  Discharge Condition: Good Disposition: Home  Diet recommendation: Postoperative sleeve gastrectomy diet (liquids only)  Filed Weights   07/16/22 0901  Weight: 115.9 kg     Hospital Course:  The patient was admitted for a planned laparoscopic sleeve gastrectomy. Please see operative note. Preoperatively the patient was given 5000 units of subcutaneous heparin for DVT prophylaxis. Postoperative prophylactic Lovenox dosing was started on the evening of postoperative day 0. ERAS protocol was used. On the evening of postoperative day 0, the patient was started on water and ice chips. On postoperative day 1 the patient had no fever or tachycardia and was tolerating water in their diet was gradually advanced throughout  the day. The patient was ambulating without difficulty. Their vital signs are stable without fever or tachycardia. Their hemoglobin had remained stable. The patient had received discharge instructions and counseling. They were deemed stable for discharge and had met discharge criteria  BP 136/70 (BP Location: Left Arm)   Pulse 65   Temp 98.2 F (36.8 C) (Oral)   Resp 18   Ht '5\' 2"'  (1.575 m)   Wt 115.9 kg   LMP 06/24/2022 Comment: urine preg.negative 07/16/22  SpO2 98%   BMI 46.75 kg/m   Gen: alert, NAD, non-toxic appearing Pupils: equal, no scleral icterus Pulm: Lungs clear to auscultation, symmetric chest rise CV: regular rate and rhythm Abd: soft, mild expected tender, nondistended. Well-healed trocar sites. No cellulitis. No incisional hernia Ext: no edema, no calf tenderness Skin: no rash, no jaundice   Discharge Instructions  Discharge Instructions     Ambulate hourly while awake   Complete by: As directed    Call MD for:  difficulty breathing, headache or visual disturbances   Complete by: As directed    Call MD for:  persistant dizziness or light-headedness   Complete by: As directed    Call MD for:  persistant nausea and vomiting   Complete by: As directed    Call MD for:  redness, tenderness, or signs of infection (pain, swelling, redness, odor or green/yellow discharge around incision site)   Complete by: As directed    Call MD for:  severe uncontrolled pain   Complete by: As directed    Call MD for:  temperature >101 F   Complete by: As directed  Diet bariatric full liquid   Complete by: As directed    Discharge instructions   Complete by: As directed    See bariatric discharge instructions   Incentive spirometry   Complete by: As directed    Perform hourly while awake      Allergies as of 07/17/2022   No Known Allergies      Medication List     STOP taking these medications    ibuprofen 200 MG tablet Commonly known as: ADVIL   metFORMIN 1000  MG tablet Commonly known as: GLUCOPHAGE       TAKE these medications    acetaminophen 500 MG tablet Commonly known as: TYLENOL Take 2 tablets (1,000 mg total) by mouth every 8 (eight) hours for 5 days. What changed:  when to take this reasons to take this   amLODipine 5 MG tablet Commonly known as: NORVASC Take 1 tablet (5 mg total) by mouth daily. Notes to patient: Monitor Blood Pressure Daily and keep a log for primary care physician.  You may need to make changes to your medications with rapid weight loss.     atorvastatin 40 MG tablet Commonly known as: LIPITOR Take 1 tablet (40 mg total) by mouth daily.   BIOTALAN CLEAR PO Take by mouth.   blood glucose meter kit and supplies Dispense based on patient and insurance preference. Use up to four times daily as directed. (FOR ICD-10 E10.9, E11.9).   blood glucose meter kit and supplies Kit Dispense based on patient and insurance preference. Use up to four times daily as directed. (FOR ICD-9 250.00, 250.01).   ondansetron 4 MG disintegrating tablet Commonly known as: ZOFRAN-ODT Take 1 tablet (4 mg total) by mouth every 6 (six) hours as needed for nausea or vomiting.   oxyCODONE 5 MG immediate release tablet Commonly known as: Oxy IR/ROXICODONE Take 1 tablet (5 mg total) by mouth every 6 (six) hours as needed for severe pain.   pantoprazole 40 MG tablet Commonly known as: PROTONIX Take 1 tablet (40 mg total) by mouth daily.   valsartan-hydrochlorothiazide 160-25 MG tablet Commonly known as: DIOVAN-HCT Take 1 tablet by mouth daily. Notes to patient: Monitor Blood Pressure Daily and keep a log for primary care physician.  You may need to make changes to your medications with rapid weight loss.          Follow-up Information     Maczis, Carlena Hurl, [place]. Go on 08/15/2022.   Specialty: General Surgery Why: at 9:30am for Dr. Redmond Pulling.  Please arrive 15 minutes prior to your appointment time.  Thank you. Contact  information: [place] [address] [phone]         Greer Pickerel, MD. Go on 09/18/2022.   Specialty: General Surgery Why: at 9::30am.  Please arrive 15 minutes prior to your appointment time.  Thank you. Contact information: [place] [address] [phone]                  The results of significant diagnostics from this hospitalization (including imaging, microbiology, ancillary and laboratory) are listed below for reference.    Significant Diagnostic Studies: No results found.  Labs: Basic Metabolic Panel: Recent Labs  Lab 07/17/22 0349  NA 136  K 3.9  CL 105  CO2 24  GLUCOSE 148*  BUN 11  CREATININE 0.56  CALCIUM 8.8*   Liver Function Tests: Recent Labs  Lab 07/17/22 0349  AST 17  ALT 16  ALKPHOS 45  BILITOT 0.4  PROT 8.6*  ALBUMIN 3.4*    CBC: Recent Labs  Lab 07/16/22 1533 07/17/22 0349  WBC  --  12.7*  NEUTROABS  --  10.8*  HGB 13.2 13.1  HCT 39.8 39.3  MCV  --  85.2  PLT  --  416*    CBG: Recent Labs  Lab 07/16/22 1933 07/16/22 2338 07/17/22 0343 07/17/22 0733 07/17/22 1127  GLUCAP 162* 176* 153* 140* 143*    Principal Problem:   S/P laparoscopic sleeve gastrectomy   Time coordinating discharge: 25 min  Signed:  Gayland Curry, MD [facility], [place] [phone] 07/17/2022, 1:00 PM

## 2022-07-17 NOTE — Progress Notes (Signed)
Patient alert and oriented, pain is controlled. Patient is tolerating fluids, advanced to protein shake today, patient is tolerating well. Reviewed Gastric sleeve discharge instructions with patient and patient is able to articulate understanding. Provided information on BELT program, Support Group and [facility] outpatient pharmacy. All questions answered. 24hr fluid recall . Per fluid hydration protocol: f/u phone call to be made within 1 week.

## 2022-07-17 NOTE — Progress Notes (Signed)
  Transition of Care [facility]) Screening Note   Patient Details  Name: Alexis Mathews Date of Birth: Jan 19, 1986   Transition of Care [facility]) CM/SW Contact:    Otelia Santee, LCSW Phone Number: 07/17/2022, 8:22 AM    Transition of Care Department [facility]) has reviewed patient and no TOC needs have been identified at this time. We will continue to monitor patient advancement through interdisciplinary progression rounds. If new patient transition needs arise, please place a TOC consult.

## 2022-07-22 ENCOUNTER — Telehealth (HOSPITAL_COMMUNITY): Payer: Self-pay | Admitting: *Deleted

## 2022-07-22 NOTE — Telephone Encounter (Signed)
1.  Tell me about your pain and pain management? Pt denies any current pain.  States that pain is controlled with one oxycodone daily.  2.  Let's talk about fluid intake.  How much total fluid are you taking in? Pt states that she is getting in at least 40oz of fluid including protein shakes, bottled water, and sugar free jello. Discussed a plan for patient to work towards meeting 64 oz of fluid.  Discussed with pt to stop drinking Body Armor due to high carbohydrate count. Reinforced no applesauce or pudding. Pt encouraged to continue to work towards meeting goal.  Pt instructed to assess status and suggestions daily utilizing Hydration Action Plan on discharge folder and to call [facility] if in the "red zone".   3.  How much protein have you taken in the last 2 days? Pt states that she is working to meet goal of goal of 60g of protein today.  Pt has already consumed one protein shake.  Reinforced that pt will need to drink two shakes daily to meet goal. Pt plans to drink remainder of protein throughout the rest of the day to meet goal.  4.  Have you had nausea?  Tell me about when have experienced nausea and what you did to help? Pt denies nausea, but states that medication helps when needed.   5.  Has the frequency or color changed with your urine? Pt states that she is urinating "fine" with no changes in frequency or urgency.     6.  Tell me what your incisions look like? "Incisions look fine". Pt denies a fever, chills.  Pt states incisions are not swollen, open, or draining.  Pt encouraged to call [facility] if incisions change.   7.  Have you been passing gas? BM? Pt states that she is having BMs. Last BM 07/18/22.     8.  If a problem or question were to arise who would you call?  Do you know contact numbers for [facility], [facility], and [facility]? Pt denies dehydration symptoms.  Pt can describe s/sx of dehydration.  Pt knows to call [facility] for surgical, [facility] for nutrition, and [facility] for non-urgent questions or  concerns.   9.  How has the walking going? Pt states she is walking around and able to be active without difficulty.   10. Are you still using your incentive spirometer?  If so, how often? Pt states that she is doing the I.S.   Pt encouraged to use incentive spirometer, at least 10x every hour while awake until she sees the surgeon.   11.  How are your vitamins and calcium going?  How are you taking them? Pt states that she will need to order the multivitamin.  Reinforced education about taking calcium supplements 3x/daily and at least two hours apart. Also had discussion with pt about resuming home medications such as BP and cholesterol.  Reinforced that pantoprazole is a daily medication.  Reminded patient that the first 30 days post-operatively are important for successful recovery.  Practice good hand hygiene, wearing a mask when appropriate (since optional in most places), and minimizing exposure to people who live outside of the home, especially if they are exhibiting any respiratory, GI, or illness-like symptoms.

## 2022-07-30 ENCOUNTER — Encounter: Payer: Self-pay | Admitting: Dietician

## 2022-07-30 ENCOUNTER — Encounter: Payer: 59 | Attending: General Surgery | Admitting: Dietician

## 2022-07-30 DIAGNOSIS — E1165 Type 2 diabetes mellitus with hyperglycemia: Secondary | ICD-10-CM | POA: Insufficient documentation

## 2022-07-30 NOTE — Progress Notes (Signed)
2 Week Post-Operative Nutrition Class   Patient was seen on 07/30/2022 for Post-Operative Nutrition education at the [facility].    Surgery date: 07/16/2022 Surgery type: sleeve  Anthropometrics  Start weight at [facility]: 270 lbs (date: 03/06/2022)  Weight: 260.3 pounds BMI: 46.83 kg/m2     Clinical  Medical hx: DM, hydradenitis, HTN, hyperlipidemia  Medications: see list: B12, metformin   Labs: A1C 6.8, Na 134, HDL 31.10, LDL 127 does have labs upcoming April 17th Notable signs/symptoms: N/A Any previous deficiencies? No   Body Composition Scale 07/30/2022  Current Body Weight 234.8  Total Body Fat % 44.1  Visceral Fat 14  Fat-Free Mass % 55.8   Total Body Water % 42.4  Muscle-Mass lbs 31.2  BMI 42.3  Body Fat Displacement          Torso  lbs 64.2         Left Leg  lbs 12.8         Right Leg  lbs 12.8         Left Arm  lbs 6.4         Right Arm   lbs 6.4      The following the learning objectives were met by the patient during this course: Identifies Phase 3 (Soft, High Proteins) Dietary Goals and will begin from 2 weeks post-operatively to 2 months post-operatively Identifies appropriate sources of fluids and proteins  Identifies appropriate fat sources and healthy verses unhealthy fat types   States protein recommendations and appropriate sources post-operatively Identifies the need for appropriate texture modifications, mastication, and bite sizes when consuming solids Identifies appropriate fat consumption and sources Identifies appropriate multivitamin and calcium sources post-operatively Describes the need for physical activity post-operatively and will follow MD recommendations States when to call healthcare provider regarding medication questions or post-operative complications   Handouts given during class include: Phase 3A: Soft, High Protein Diet Handout Phase 3 High Protein Meals Healthy Fats   Follow-Up Plan: Patient will  follow-up at [facility] in 6 weeks for 2 month post-op nutrition visit for diet advancement per MD.

## 2022-08-05 ENCOUNTER — Telehealth: Payer: Self-pay | Admitting: Dietician

## 2022-08-05 NOTE — Telephone Encounter (Signed)
RD called pt to verify fluid intake once starting soft, solid proteins 2 week post-bariatric surgery.   Daily Fluid intake:  Daily Protein intake:  Bowel Habits:   Concerns/issues:   Left voice message 

## 2022-09-19 ENCOUNTER — Encounter: Payer: 59 | Attending: Internal Medicine | Admitting: Skilled Nursing Facility1

## 2022-09-19 ENCOUNTER — Encounter: Payer: Self-pay | Admitting: Skilled Nursing Facility1

## 2022-09-19 DIAGNOSIS — E1165 Type 2 diabetes mellitus with hyperglycemia: Secondary | ICD-10-CM | POA: Diagnosis not present

## 2022-09-19 DIAGNOSIS — E669 Obesity, unspecified: Secondary | ICD-10-CM | POA: Insufficient documentation

## 2022-09-19 NOTE — Progress Notes (Signed)
Bariatric Nutrition Follow-Up Visit Medical Nutrition Therapy   NUTRITION ASSESSMENT    Surgery date: 07/16/2022 Surgery type: sleeve  Anthropometrics  Start weight at [facility]: 270 lbs (date: 03/06/2022)  Weight: 226.8 pounds   Clinical  Medical hx: DM, hydradenitis, HTN, hyperlipidemia  Medications: see list: B12  Labs: A1C 5.9 Notable signs/symptoms: N/A Any previous deficiencies? No   Body Composition Scale 07/30/2022 09/19/2022  Current Body Weight 234.8 226.8  Total Body Fat % 44.1 43.2  Visceral Fat 14 13  Fat-Free Mass % 55.8 56.7   Total Body Water % 42.4 42.8  Muscle-Mass lbs 31.2 31.1  BMI 42.3 40.8  Body Fat Displacement           Torso  lbs 64.2 60.8         Left Leg  lbs 12.8 12.1         Right Leg  lbs 12.8 12.1         Left Arm  lbs 6.4 6.0         Right Arm   lbs 6.4 6.0     Lifestyle & Dietary Hx   Pt states she wishes to add all healthy foods back in as it is too restrictive.  Pt arrives having added in most categories of food.  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   Pt states she works 2 jobs working 13-14 hours 4 days a week and then 8 hours one day a week.  Pt states she did get an elliptical.   Pt states her sleep has improved.   Estimated daily fluid intake: 64+ oz Estimated daily protein intake: 80 g Supplements: multi and calcium Current average weekly physical activity: Walking at work   24-Hr Dietary Recall First Meal: yogurt or shake Snack: cheese and cranberries and almonds Second Meal: [place] sandwich on wheat Snack:  protein shake Third Meal:  Snack:  Beverages: water  Post-Op Goals/ Signs/ Symptoms Using straws: no Drinking while eating: no Chewing/swallowing difficulties: no Changes in vision: no Changes to mood/headaches: no Hair loss/changes to skin/nails: no Difficulty focusing/concentrating: no Sweating: no Limb weakness: no Dizziness/lightheadedness: no Palpitations: no   Carbonated/caffeinated beverages: no N/V/D/C/Gas: no Abdominal pain: no Dumping syndrome: no    NUTRITION DIAGNOSIS  Overweight/obesity (Triana-3.3) related to past poor dietary habits and physical inactivity as evidenced by completed bariatric surgery and following dietary guidelines for continued weight loss and healthy nutrition status.     NUTRITION INTERVENTION Nutrition counseling (C-1) and education (E-2) to facilitate bariatric surgery goals, including: The importance of consuming adequate calories as well as certain nutrients daily due to the body's need for essential vitamins, minerals, and fats The importance of daily physical activity and to reach a goal of at least 150 minutes of moderate to vigorous physical activity weekly (or as directed by their physician) due to benefits such as increased musculature and improved lab values The importance of intuitive eating specifically learning hunger-satiety cues and understanding the importance of learning a new body: The importance of mindful eating to avoid grazing behaviors  Encouraged patient to honor their body's internal hunger and fullness cues.  Throughout the day, check in mentally and rate hunger. Stop eating when satisfied not full regardless of how much food is left on the plate.  Get more if still hungry 20-30 minutes later.  The key is to honor satisfaction so throughout the meal, rate fullness factor and stop when comfortably satisfied not physically full.  The key is to honor hunger and fullness without any feelings of guilt or shame.  Pay attention to what the internal cues are, rather than any external factors. This will enhance the confidence you have in listening to your own body and following those internal cues enabling you to increase how often you eat when you are hungry not out of appetite and stop when you are satisfied not full.  Encouraged pt to continue to eat balanced meals inclusive of non starchy vegetables 2 times a  day 7 days a week Encouraged pt to choose lean protein sources: limiting beef, pork, sausage, hotdogs, and lunch meat Encourage pt to choose healthy fats such as plant based limiting animal fats Encouraged pt to continue to drink a minium 64 fluid ounces with half being plain water to satisfy proper hydration   Goals: -add in resistance 2-3 days a week -choose complex carbohydrates -if eating a sandwich have half of one and add non starchy vegetables to the side  Handouts Provided Include  MyPlate  Learning Style & Readiness for Change Teaching method utilized: Visual & Auditory  Demonstrated degree of understanding via: Teach Back  Readiness Level: action Barriers to learning/adherence to lifestyle change: none stated  RD's Notes for Next Visit Assess adherence to pt chosen goals    MONITORING & EVALUATION Dietary intake, weekly physical activity, body weight  Next Steps Patient is to follow-up in 3 months

## 2022-10-03 ENCOUNTER — Encounter: Payer: Self-pay | Admitting: Internal Medicine

## 2022-10-03 ENCOUNTER — Ambulatory Visit (INDEPENDENT_AMBULATORY_CARE_PROVIDER_SITE_OTHER): Payer: 59 | Admitting: Internal Medicine

## 2022-10-03 VITALS — BP 110/78 | Temp 98.2°F | Wt 222.6 lb

## 2022-10-03 DIAGNOSIS — E1169 Type 2 diabetes mellitus with other specified complication: Secondary | ICD-10-CM | POA: Diagnosis not present

## 2022-10-03 DIAGNOSIS — E1165 Type 2 diabetes mellitus with hyperglycemia: Secondary | ICD-10-CM | POA: Diagnosis not present

## 2022-10-03 DIAGNOSIS — I10 Essential (primary) hypertension: Secondary | ICD-10-CM

## 2022-10-03 DIAGNOSIS — E785 Hyperlipidemia, unspecified: Secondary | ICD-10-CM

## 2022-10-03 DIAGNOSIS — Z9884 Bariatric surgery status: Secondary | ICD-10-CM

## 2022-10-03 LAB — POCT GLYCOSYLATED HEMOGLOBIN (HGB A1C): Hemoglobin A1C: 5.5 % (ref 4.0–5.6)

## 2022-10-03 NOTE — Progress Notes (Signed)
Established Patient Office Visit     CC/Reason for Visit: Follow-up chronic medical conditions  HPI: Alexis Mathews is a 36 y.o. female who is coming in today for the above mentioned reasons. Past Medical History is significant for:-Attention, hyperlipidemia, type 2 diabetes, morbid obesity and vitamin D deficiency.  Beginning of August she had a sleeve gastrectomy and has lost about 25 pounds since then.  She has no acute concerns or complaints.  She declines flu and COVID vaccines.   Past Medical/Surgical History: Past Medical History:  Diagnosis Date   Abscess    neck   Anxiety    Diabetes mellitus without complication ([place])    Hidradenitis 10/26/2020   posterior neck; self reported history axillary abscesses.   Hypertension     Past Surgical History:  Procedure Laterality Date   CYST REMOVAL NECK N/A 10/26/2020   Procedure: EXCISIONAL DEBRIDEMENT POSTERIOR NECK ABSCESS;  Surgeon: Coralie Keens, MD;  Location: [facility];  Service: General;  Laterality: N/A;   HIATAL HERNIA REPAIR N/A 07/16/2022   Procedure: HERNIA REPAIR HIATAL;  Surgeon: Greer Pickerel, MD;  [facility];  Service: General;  Laterality: N/A;   INCISION AND DRAINAGE PERIRECTAL ABSCESS N/A 07/17/2020   Procedure: IRRIGATION AND DEBRIDEMENT GROIN ABSCESS;  Surgeon: Greer Pickerel, MD;  Location: [facility];  Service: General;  Laterality: N/A;   IRRIGATION AND DEBRIDEMENT ABSCESS N/A 07/17/2020   Procedure: IRRIGATION AND DEBRIDEMENT NECK ABSCESS;  Surgeon: Greer Pickerel, MD;  Location: [facility];  Service: General;  Laterality: N/A;   LAPAROSCOPIC GASTRIC SLEEVE RESECTION N/A 07/16/2022   Procedure: LAPAROSCOPIC SLEEVE GASTRECTOMY;  Surgeon: Greer Pickerel, MD;  [facility];  Service: General;  Laterality: N/A;   UPPER GI ENDOSCOPY N/A 07/16/2022   Procedure: UPPER GI ENDOSCOPY;  Surgeon: Greer Pickerel, MD;  [facility];  Service: General;  Laterality: N/A;    Social History:  reports that she has never smoked. She  has never used smokeless tobacco. She reports current alcohol use. She reports that she does not use drugs.  Allergies: No Known Allergies  Family History:  Family History  Problem Relation Age of Onset   Diabetes Maternal Grandfather      Current Outpatient Medications:    amLODipine (NORVASC) 5 MG tablet, Take 1 tablet (5 mg total) by mouth daily., Disp: 90 tablet, Rfl: 1   atorvastatin (LIPITOR) 40 MG tablet, Take 1 tablet (40 mg total) by mouth daily., Disp: 90 tablet, Rfl: 1   valsartan-hydrochlorothiazide (DIOVAN-HCT) 160-25 MG tablet, Take 1 tablet by mouth daily., Disp: 90 tablet, Rfl: 1   B Complex-Lysine-Zn-FA (BIOTALAN CLEAR PO), Take by mouth., Disp: , Rfl:    blood glucose meter kit and supplies KIT, Dispense based on patient and insurance preference. Use up to four times daily as directed. (FOR ICD-9 250.00, 250.01)., Disp: 1 each, Rfl: 0   blood glucose meter kit and supplies, Dispense based on patient and insurance preference. Use up to four times daily as directed. (FOR ICD-10 E10.9, E11.9)., Disp: 1 each, Rfl: 0  Review of Systems:  Constitutional: Denies fever, chills, diaphoresis, appetite change and fatigue.  HEENT: Denies photophobia, eye pain, redness, hearing loss, ear pain, congestion, sore throat, rhinorrhea, sneezing, mouth sores, trouble swallowing, neck pain, neck stiffness and tinnitus.   Respiratory: Denies SOB, DOE, cough, chest tightness,  and wheezing.   Cardiovascular: Denies chest pain, palpitations and leg swelling.  Gastrointestinal: Denies nausea, vomiting, abdominal pain, diarrhea, constipation, blood in stool and abdominal distention.  Genitourinary: Denies dysuria, urgency, frequency, hematuria, flank pain and difficulty urinating.  Endocrine: Denies: hot or cold intolerance, sweats, changes in hair or nails, polyuria, polydipsia. Musculoskeletal: Denies myalgias, back pain, joint swelling, arthralgias and gait problem.  Skin: Denies pallor, rash  and wound.  Neurological: Denies dizziness, seizures, syncope, weakness, light-headedness, numbness and headaches.  Hematological: Denies adenopathy. Easy bruising, personal or family bleeding history  Psychiatric/Behavioral: Denies suicidal ideation, mood changes, confusion, nervousness, sleep disturbance and agitation    Physical Exam: Vitals:   10/03/22 1542  BP: 110/78  Temp: 98.2 F (36.8 C)  TempSrc: Oral  Weight: 222 lb 9.6 oz (101 kg)    Body mass index is 40.07 kg/m.   Constitutional: NAD, calm, comfortable Eyes: PERRL, lids and conjunctivae normal, wears corrective lenses ENMT: Mucous membranes are moist.  Respiratory: clear to auscultation bilaterally, no wheezing, no crackles. Normal respiratory effort. No accessory muscle use.  Cardiovascular: Regular rate and rhythm, no murmurs / rubs / gallops. No extremity edema.  Psychiatric: Normal judgment and insight. Alert and oriented x 3. Normal mood.    Impression and Plan:  Type 2 diabetes mellitus with hyperglycemia, without long-term current use of insulin ([place]) - Plan: POCT glycosylated hemoglobin (Hb A1C)  Primary hypertension  Morbid obesity (HCC)  Hyperlipidemia associated with type 2 diabetes mellitus ([place])  S/P laparoscopic sleeve gastrectomy  -A1c of 5.5 demonstrates excellent diabetic control.  She has been taken off metformin. -Blood pressure is well controlled. -She has been congratulated on weight loss success thus far. -At last check over the spring her lipid profile showed good lipid management with an LDL of 63.  Time spent:31 minutes reviewing chart, interviewing and examining patient and formulating plan of care.      Lelon Frohlich, MD [facility]

## 2022-11-29 LAB — HM DIABETES EYE EXAM

## 2022-12-24 ENCOUNTER — Encounter: Payer: 59 | Admitting: Skilled Nursing Facility1

## 2022-12-30 ENCOUNTER — Ambulatory Visit: Payer: 59 | Admitting: Skilled Nursing Facility1

## 2022-12-30 ENCOUNTER — Encounter: Payer: 59 | Attending: Internal Medicine | Admitting: Skilled Nursing Facility1

## 2022-12-30 ENCOUNTER — Encounter: Payer: Self-pay | Admitting: Skilled Nursing Facility1

## 2022-12-30 NOTE — Progress Notes (Signed)
Bariatric Nutrition Follow-Up Visit Medical Nutrition Therapy   NUTRITION ASSESSMENT    Surgery date: 07/16/2022 Surgery type: sleeve  Anthropometrics  Start weight at [facility]: 270 lbs (date: 03/06/2022)  Weight: 208.5 pounds   Clinical  Medical hx: DM, hydradenitis, HTN, hyperlipidemia  Medications: see list: B12  Labs: A1C 5.9 Notable signs/symptoms: N/A Any previous deficiencies? No   Body Composition Scale 07/30/2022 09/19/2022 12/30/2022  Current Body Weight 234.8 226.8 208.5  Total Body Fat % 44.1 43.2 41.2  Visceral Fat 14 13 12   Fat-Free Mass % 55.8 56.7 58.7   Total Body Water % 42.4 42.8 43.8  Muscle-Mass lbs 31.2 31.1 30.6  BMI 42.3 40.8 37.9  Body Fat Displacement            Torso  lbs 64.2 60.8 53.2         Left Leg  lbs 12.8 12.1 10.6         Right Leg  lbs 12.8 12.1 10.6         Left Arm  lbs 6.4 6.0 5.3         Right Arm   lbs 6.4 6.0 5.3     Lifestyle & Dietary Hx  Pt states she has been having some hair loss since her surgery. Pt states she has been having a bowel movement everyday. Pt states she usually gets around 4 hours of sleep every night. Pt states it often difficult to make meals from home with her work schedule.  Pt states she works 2 jobs working 13-14 hours 4 days a week and then 8 hours one day a week.  Pt states she often feels exhausted by the end of the week with her current work schedule and little amount of sleep.  Pt states she usually meal preps on weekends for her lunches during the week. Pt states she is open to trying more non-starchy vegetables. Pt states she wants to find time to add more exercise to her weekly routine.    Estimated daily fluid intake: 64+ oz Estimated daily protein intake: 80 g Supplements: multi and calcium Current average weekly physical activity: Walking at work   24-Hr Dietary Recall First Meal (6 am): coffee Snack (9 am): yogurt and granola Snack: grapes Second Meal (12 pm): grilled chicken and  broccoli and white rice Snack: small bag of pretzels and 1 apple Third Meal: chicken salad croissant (bought at work) or salad (brought from home) or Lunchable with apple Snack: None reported.  Beverages: water  Post-Op Goals/ Signs/ Symptoms Using straws: no Drinking while eating: no Chewing/swallowing difficulties: no Changes in vision: no Changes to mood/headaches: no Hair loss/changes to skin/nails: no Difficulty focusing/concentrating: no Sweating: no Limb weakness: no Dizziness/lightheadedness: no Palpitations: no  Carbonated/caffeinated beverages: no N/V/D/C/Gas: no Abdominal pain: no Dumping syndrome: no    NUTRITION DIAGNOSIS  Overweight/obesity ([place]-3.3) related to past poor dietary habits and physical inactivity as evidenced by completed bariatric surgery and following dietary guidelines for continued weight loss and healthy nutrition status.     NUTRITION INTERVENTION Nutrition counseling (C-1) and education (E-2) to facilitate bariatric surgery goals, including: The importance of consuming adequate calories as well as certain nutrients daily due to the body's need for essential vitamins, minerals, and fats The importance of daily physical activity and to reach a goal of at least 150 minutes of moderate to vigorous physical activity weekly (or as directed by their physician) due to benefits such as increased musculature and improved lab values The importance of  intuitive eating specifically learning hunger-satiety cues and understanding the importance of learning a new body: The importance of mindful eating to avoid grazing behaviors  Encouraged patient to honor their body's internal hunger and fullness cues.  Throughout the day, check in mentally and rate hunger. Stop eating when satisfied not full regardless of how much food is left on the plate.  Get more if still hungry 20-30 minutes later.  The key is to honor satisfaction so throughout the meal, rate fullness factor  and stop when comfortably satisfied not physically full. The key is to honor hunger and fullness without any feelings of guilt or shame.  Pay attention to what the internal cues are, rather than any external factors. This will enhance the confidence you have in listening to your own body and following those internal cues enabling you to increase how often you eat when you are hungry not out of appetite and stop when you are satisfied not full.  Encouraged pt to continue to eat balanced meals inclusive of non starchy vegetables 2 times a day 7 days a week Encouraged pt to choose lean protein sources: limiting beef, pork, sausage, hotdogs, and lunch meat Encourage pt to choose healthy fats such as plant based limiting animal fats Encouraged pt to continue to drink a minium 64 fluid ounces with half being plain water to satisfy proper hydration   Goals: Add in exercise when possible based on daily work schedule; let it be organic. Alternate white and brown rice for lunches. Choose more complex carbohydrates. Add more non-starchy vegetables to lunch and dinner.  Handouts Provided Include  None.  Learning Style & Readiness for Change Teaching method utilized: Visual & Auditory  Demonstrated degree of understanding via: Teach Back  Readiness Level: action Barriers to learning/adherence to lifestyle change: none stated  RD's Notes for Next Visit Assess adherence to pt chosen goals    MONITORING & EVALUATION Dietary intake, weekly physical activity, body weight.  Next Steps Patient is to follow-up in 3 months.

## 2023-02-03 ENCOUNTER — Encounter: Payer: Self-pay | Admitting: Internal Medicine

## 2023-02-03 ENCOUNTER — Ambulatory Visit (INDEPENDENT_AMBULATORY_CARE_PROVIDER_SITE_OTHER): Payer: 59 | Admitting: Internal Medicine

## 2023-02-03 VITALS — BP 122/79 | Ht 63.0 in | Wt 207.6 lb

## 2023-02-03 DIAGNOSIS — Z124 Encounter for screening for malignant neoplasm of cervix: Secondary | ICD-10-CM

## 2023-02-03 DIAGNOSIS — Z0000 Encounter for general adult medical examination without abnormal findings: Secondary | ICD-10-CM

## 2023-02-03 DIAGNOSIS — E559 Vitamin D deficiency, unspecified: Secondary | ICD-10-CM

## 2023-02-03 DIAGNOSIS — I10 Essential (primary) hypertension: Secondary | ICD-10-CM | POA: Diagnosis not present

## 2023-02-03 DIAGNOSIS — E1165 Type 2 diabetes mellitus with hyperglycemia: Secondary | ICD-10-CM | POA: Diagnosis not present

## 2023-02-03 DIAGNOSIS — Z1159 Encounter for screening for other viral diseases: Secondary | ICD-10-CM

## 2023-02-03 DIAGNOSIS — E1169 Type 2 diabetes mellitus with other specified complication: Secondary | ICD-10-CM

## 2023-02-03 DIAGNOSIS — E785 Hyperlipidemia, unspecified: Secondary | ICD-10-CM

## 2023-02-03 LAB — CBC WITH DIFFERENTIAL/PLATELET
Basophils Absolute: 0 10*3/uL (ref 0.0–0.1)
Basophils Relative: 0.4 % (ref 0.0–3.0)
Eosinophils Absolute: 0.2 10*3/uL (ref 0.0–0.7)
Eosinophils Relative: 2.2 % (ref 0.0–5.0)
HCT: 39.7 % (ref 36.0–46.0)
Hemoglobin: 13.4 g/dL (ref 12.0–15.0)
Lymphocytes Relative: 35.3 % (ref 12.0–46.0)
Lymphs Abs: 2.6 10*3/uL (ref 0.7–4.0)
MCHC: 33.8 g/dL (ref 30.0–36.0)
MCV: 85.3 fl (ref 78.0–100.0)
Monocytes Absolute: 0.6 10*3/uL (ref 0.1–1.0)
Monocytes Relative: 8.6 % (ref 3.0–12.0)
Neutro Abs: 3.9 10*3/uL (ref 1.4–7.7)
Neutrophils Relative %: 53.5 % (ref 43.0–77.0)
Platelets: 404 10*3/uL — ABNORMAL HIGH (ref 150.0–400.0)
RBC: 4.66 Mil/uL (ref 3.87–5.11)
RDW: 13.9 % (ref 11.5–15.5)
WBC: 7.2 10*3/uL (ref 4.0–10.5)

## 2023-02-03 LAB — COMPREHENSIVE METABOLIC PANEL
ALT: 10 U/L (ref 0–35)
AST: 14 U/L (ref 0–37)
Albumin: 3.7 g/dL (ref 3.5–5.2)
Alkaline Phosphatase: 61 U/L (ref 39–117)
BUN: 13 mg/dL (ref 6–23)
CO2: 32 mEq/L (ref 19–32)
Calcium: 9.3 mg/dL (ref 8.4–10.5)
Chloride: 98 mEq/L (ref 96–112)
Creatinine, Ser: 0.73 mg/dL (ref 0.40–1.20)
GFR: 104.68 mL/min (ref >60.00)
Glucose, Bld: 83 mg/dL (ref 70–99)
Potassium: 4.1 mEq/L (ref 3.5–5.1)
Sodium: 136 mEq/L (ref 135–145)
Total Bilirubin: 0.4 mg/dL (ref 0.2–1.2)
Total Protein: 8.4 g/dL — ABNORMAL HIGH (ref 6.0–8.3)

## 2023-02-03 LAB — LIPID PANEL
Cholesterol: 166 mg/dL (ref 0–200)
HDL: 36.8 mg/dL — ABNORMAL LOW (ref >39.00)
LDL Cholesterol: 117 mg/dL — ABNORMAL HIGH (ref 0–99)
NonHDL: 129.24
Total CHOL/HDL Ratio: 5
Triglycerides: 62 mg/dL (ref 0.0–149.0)
VLDL: 12.4 mg/dL (ref 0.0–40.0)

## 2023-02-03 LAB — HEMOGLOBIN A1C: Hgb A1c MFr Bld: 5.7 % (ref 4.6–6.5)

## 2023-02-03 LAB — TSH: TSH: 4.22 u[IU]/mL (ref 0.35–5.50)

## 2023-02-03 LAB — VITAMIN D 25 HYDROXY (VIT D DEFICIENCY, FRACTURES): VITD: 19.89 ng/mL — ABNORMAL LOW (ref 30.00–100.00)

## 2023-02-03 LAB — VITAMIN B12: Vitamin B-12: 627 pg/mL (ref 211–911)

## 2023-02-03 NOTE — Progress Notes (Signed)
Established Patient Office Visit     CC/Reason for Visit: Annual preventive exam  HPI: Alexis Mathews is a 37 y.o. female who is coming in today for the above mentioned reasons. Past Medical History is significant for: Hyperlipidemia, hypertension, morbid obesity, type 2 diabetes, vitamin D deficiency.  She had a gastric sleeve in August 2023 and has dropped close to 80 pounds since then.  She is feeling well.   Past Medical/Surgical History: Past Medical History:  Diagnosis Date   Abscess    neck   Anxiety    Diabetes mellitus without complication ([place])    Hidradenitis 10/26/2020   posterior neck; self reported history axillary abscesses.   Hypertension     Past Surgical History:  Procedure Laterality Date   CYST REMOVAL NECK N/A 10/26/2020   Procedure: EXCISIONAL DEBRIDEMENT POSTERIOR NECK ABSCESS;  Surgeon: Coralie Keens, MD;  Location: [facility];  Service: General;  Laterality: N/A;   HIATAL HERNIA REPAIR N/A 07/16/2022   Procedure: HERNIA REPAIR HIATAL;  Surgeon: Greer Pickerel, MD;  Location: [facility];  Service: General;  Laterality: N/A;   INCISION AND DRAINAGE PERIRECTAL ABSCESS N/A 07/17/2020   Procedure: IRRIGATION AND DEBRIDEMENT GROIN ABSCESS;  Surgeon: Greer Pickerel, MD;  Location: [facility];  Service: General;  Laterality: N/A;   IRRIGATION AND DEBRIDEMENT ABSCESS N/A 07/17/2020   Procedure: IRRIGATION AND DEBRIDEMENT NECK ABSCESS;  Surgeon: Greer Pickerel, MD;  Location: [facility];  Service: General;  Laterality: N/A;   LAPAROSCOPIC GASTRIC SLEEVE RESECTION N/A 07/16/2022   Procedure: LAPAROSCOPIC SLEEVE GASTRECTOMY;  Surgeon: Greer Pickerel, MD;  Location: [facility];  Service: General;  Laterality: N/A;   UPPER GI ENDOSCOPY N/A 07/16/2022   Procedure: UPPER GI ENDOSCOPY;  Surgeon: Greer Pickerel, MD;  Location: [facility];  Service: General;  Laterality: N/A;    Social History:  reports that she has never smoked. She has never used smokeless tobacco. She reports current alcohol use. She  reports that she does not use drugs.  Allergies: No Known Allergies  Family History:  Family History  Problem Relation Age of Onset   Diabetes Maternal Grandfather      Current Outpatient Medications:    amLODipine (NORVASC) 5 MG tablet, Take 1 tablet (5 mg total) by mouth daily., Disp: 90 tablet, Rfl: 1   atorvastatin (LIPITOR) 40 MG tablet, Take 1 tablet (40 mg total) by mouth daily., Disp: 90 tablet, Rfl: 1   B Complex-Lysine-Zn-FA (BIOTALAN CLEAR PO), Take by mouth., Disp: , Rfl:    blood glucose meter kit and supplies KIT, Dispense based on patient and insurance preference. Use up to four times daily as directed. (FOR ICD-9 250.00, 250.01)., Disp: 1 each, Rfl: 0   blood glucose meter kit and supplies, Dispense based on patient and insurance preference. Use up to four times daily as directed. (FOR ICD-10 E10.9, E11.9)., Disp: 1 each, Rfl: 0   valsartan-hydrochlorothiazide (DIOVAN-HCT) 160-25 MG tablet, Take 1 tablet by mouth daily., Disp: 90 tablet, Rfl: 1  Review of Systems:  Negative unless indicated in HPI.   Physical Exam: Vitals:   02/03/23 0707 02/03/23 0723  BP: (!) 130/90 122/79  Weight: 207 lb 9.6 oz (94.2 kg)   Height: 5' 3"$  (1.6 m)     Body mass index is 36.77 kg/m.   Physical Exam Vitals reviewed.  Constitutional:      General: She is not in acute distress.    Appearance: Normal appearance. She is not ill-appearing, toxic-appearing or diaphoretic.  HENT:  Head: Normocephalic.     Right Ear: Tympanic membrane, ear canal and external ear normal. There is no impacted cerumen.     Left Ear: Tympanic membrane, ear canal and external ear normal. There is no impacted cerumen.     Nose: Nose normal.     Mouth/Throat:     Mouth: Mucous membranes are moist.     Pharynx: Oropharynx is clear. No oropharyngeal exudate or posterior oropharyngeal erythema.  Eyes:     General: No scleral icterus.       Right eye: No discharge.        Left eye: No discharge.      Conjunctiva/sclera: Conjunctivae normal.     Pupils: Pupils are equal, round, and reactive to light.  Neck:     Vascular: No carotid bruit.  Cardiovascular:     Rate and Rhythm: Normal rate and regular rhythm.     Pulses: Normal pulses.     Heart sounds: Normal heart sounds.  Pulmonary:     Effort: Pulmonary effort is normal. No respiratory distress.     Breath sounds: Normal breath sounds.  Abdominal:     General: Abdomen is flat. Bowel sounds are normal.     Palpations: Abdomen is soft.  Musculoskeletal:        General: Normal range of motion.     Cervical back: Normal range of motion.  Skin:    General: Skin is warm and dry.     Capillary Refill: Capillary refill takes less than 2 seconds.  Neurological:     General: No focal deficit present.     Mental Status: She is alert and oriented to person, place, and time. Mental status is at baseline.  Psychiatric:        Mood and Affect: Mood normal.        Behavior: Behavior normal.        Thought Content: Thought content normal.        Judgment: Judgment normal.     Diabetic Foot Exam - Simple   Simple Foot Form Diabetic Foot exam was performed with the following findings: Yes   Visual Inspection No deformities, no ulcerations, no other skin breakdown bilaterally: Yes Sensation Testing Intact to touch and monofilament testing bilaterally: Yes Pulse Check Posterior Tibialis and Dorsalis pulse intact bilaterally: Yes Comments      Impression and Plan:  Encounter for preventive health examination  Type 2 diabetes mellitus with hyperglycemia, without long-term current use of insulin (HCC) - Plan: CBC with Differential/Platelet, Comprehensive metabolic panel, Lipid panel, Hemoglobin A1c  Primary hypertension  Vitamin D deficiency - Plan: VITAMIN D 25 Hydroxy (Vit-D Deficiency, Fractures)  Hyperlipidemia associated with type 2 diabetes mellitus (HCC)  Morbid obesity (HCC) - Plan: TSH, Vitamin B12  Screening for  cervical cancer - Plan: Ambulatory referral to Gynecology  Encounter for hepatitis C screening test for low risk patient - Plan: Hepatitis C antibody  -Recommend routine eye and dental care. -Healthy lifestyle discussed in detail. -Labs to be updated today. -Prostate cancer screening: Not applicable Health Maintenance  Topic Date Due   Hepatitis C Screening: USPSTF Recommendation to screen - Ages 25-79 yo.  Never done   Pap Smear  02/03/2023*   COVID-19 Vaccine (1) 02/19/2023*   Flu Shot  03/16/2023*   Eye exam for diabetics  02/03/2026*   Yearly kidney health urinalysis for diabetes  04/02/2023   Hemoglobin A1C  04/04/2023   Yearly kidney function blood test for diabetes  07/18/2023   Complete  foot exam   02/04/2024   DTaP/Tdap/Td vaccine (2 - Td or Tdap) 12/28/2031   HIV Screening  Completed   HPV Vaccine  Aged Out  *Topic was postponed. The date shown is not the original due date.    -GYN referral has been placed.    Lelon Frohlich, MD [facility] at [facility]

## 2023-02-04 ENCOUNTER — Other Ambulatory Visit: Payer: Self-pay | Admitting: Internal Medicine

## 2023-02-04 DIAGNOSIS — E559 Vitamin D deficiency, unspecified: Secondary | ICD-10-CM

## 2023-02-04 LAB — HEPATITIS C ANTIBODY: Hepatitis C Ab: NONREACTIVE

## 2023-02-04 MED ORDER — VITAMIN D (ERGOCALCIFEROL) 1.25 MG (50000 UNIT) PO CAPS: 50000.0000 [IU] | ORAL_CAPSULE | ORAL | 0 refills | Status: AC

## 2023-03-13 ENCOUNTER — Other Ambulatory Visit (HOSPITAL_COMMUNITY)
Admission: RE | Admit: 2023-03-13 | Discharge: 2023-03-13 | Disposition: A | Discharge status: Home / Self Care | Payer: 59 | Source: Ambulatory Visit | Attending: Radiology | Admitting: Radiology

## 2023-03-13 ENCOUNTER — Ambulatory Visit (INDEPENDENT_AMBULATORY_CARE_PROVIDER_SITE_OTHER): Payer: 59 | Admitting: Radiology

## 2023-03-13 ENCOUNTER — Encounter: Payer: Self-pay | Admitting: Radiology

## 2023-03-13 VITALS — BP 138/76 | Ht 62.75 in | Wt 205.0 lb

## 2023-03-13 DIAGNOSIS — Z113 Encounter for screening for infections with a predominantly sexual mode of transmission: Secondary | ICD-10-CM

## 2023-03-13 DIAGNOSIS — Z01419 Encounter for gynecological examination (general) (routine) without abnormal findings: Secondary | ICD-10-CM | POA: Diagnosis present

## 2023-03-13 NOTE — Progress Notes (Signed)
   Alexis Mathews 11-14-1986 [id number]   History:  37 y.o. G0 presents for annual exam has never had a pap smear. Not currently sexually active but interested in what her [facility] options are with HTN. Has lost 80lbs in the past 2months since bariatric surgery.   Gynecologic History Patient's last menstrual period was 03/01/2023 (exact date). Period Cycle (Days): 28 Period Duration (Days): 7 Period Pattern: Regular Menstrual Flow: Moderate Menstrual Control: Maxi pad Dysmenorrhea: None Contraception/Family planning: abstinence Sexually active: no   Obstetric History OB History  Gravida Para Term Preterm AB Living  0 0 0 0 0 0  SAB IAB Ectopic Multiple Live Births  0 0 0 0 0     The following portions of the patient's history were reviewed and updated as appropriate: allergies, current medications, past family history, past medical history, past social history, past surgical history, and problem list.  Review of Systems Pertinent items noted in HPI and remainder of comprehensive ROS otherwise negative.   Past medical history, past surgical history, family history and social history were all reviewed and documented in the EPIC chart.   Exam:  Vitals:   03/13/23 1500 03/13/23 1506  BP: (!) 148/84 138/76  Weight: 205 lb (93 kg)   Height: 5' 2.75" (1.594 m)    Body mass index is 36.6 kg/m.  General appearance:  Normal Thyroid:  Symmetrical, normal in size, without palpable masses or nodularity. Respiratory  Auscultation:  Clear without wheezing or rhonchi Cardiovascular  Auscultation:  Regular rate, without rubs, murmurs or gallops  Edema/varicosities:  Not grossly evident Abdominal  Soft,nontender, without masses, guarding or rebound.  Liver/spleen:  No organomegaly noted  Hernia:  None appreciated  Skin  Inspection:  Grossly normal Breasts: Examined lying and sitting.   Right: Without masses, retractions, nipple discharge or axillary adenopathy.   Left: Without  masses, retractions, nipple discharge or axillary adenopathy. Genitourinary   Inguinal/mons:  Normal without inguinal adenopathy  External genitalia:  Normal appearing vulva with no masses, tenderness, or lesions  BUS/Urethra/Skene's glands:  Normal without masses or exudate  Vagina:  Normal appearing with normal color and discharge, no lesions  Cervix:  Normal appearing without discharge or lesions  Uterus:  Normal in size, shape and contour.  Mobile, nontender  Adnexa/parametria:     Rt: Normal in size, without masses or tenderness.   Lt: Normal in size, without masses or tenderness.  Anus and perineum: Normal   Patient informed chaperone available to be present for breast and pelvic exam. Patient has requested no chaperone to be present. Patient has been advised what will be completed during breast and pelvic exam.   Assessment/Plan:   1. Well woman exam with routine gynecological exam - Discussed BC options for HTN, POPs, IUD, Nexplanon, Phexxi, will consider - Cytology - PAP( [place])  2. Screening for STDs (sexually transmitted diseases) - Cytology - PAP( [place])     Discussed SBE, colonoscopy and DEXA screening as directed/appropriate. Recommend 128mins of exercise weekly, including weight bearing exercise. Encouraged the use of seatbelts and sunscreen. Return in 1 year for annual or as needed.   Rubbie Battiest B WHNP-BC 3:17 PM 03/13/2023

## 2023-03-18 LAB — CYTOLOGY - PAP
Adequacy: ABSENT
Chlamydia: NEGATIVE
Comment: NEGATIVE
Comment: NEGATIVE
Comment: NORMAL
Diagnosis: NEGATIVE
Neisseria Gonorrhea: NEGATIVE
Trichomonas: NEGATIVE

## 2023-04-01 ENCOUNTER — Encounter: Payer: Self-pay | Admitting: Skilled Nursing Facility1

## 2023-04-01 ENCOUNTER — Encounter: Payer: 59 | Attending: General Surgery | Admitting: Skilled Nursing Facility1

## 2023-04-01 VITALS — Ht 62.5 in | Wt 202.8 lb

## 2023-04-01 DIAGNOSIS — E1165 Type 2 diabetes mellitus with hyperglycemia: Secondary | ICD-10-CM | POA: Insufficient documentation

## 2023-04-01 NOTE — Progress Notes (Signed)
Bariatric Nutrition Follow-Up Visit Medical Nutrition Therapy   NUTRITION ASSESSMENT    Surgery date: 07/16/2022 Surgery type: sleeve  Anthropometrics  Start weight at [facility]: 270 lbs (date: 03/06/2022)  Weight: 202.8 pounds   Clinical  Medical hx: DM, hydradenitis, HTN, hyperlipidemia  Medications: see list: B12  Labs: A1C 5.9 Notable signs/symptoms: N/A Any previous deficiencies? No   Body Composition Scale 07/30/2022 09/19/2022 12/30/2022 04/01/2023  Current Body Weight 234.8 226.8 208.5 202.8  Total Body Fat % 44.1 43.2 41.2 40.2  Visceral Fat Fat-Free Mass % 55.8 56.7 58.7 59.7   Total Body Water % 42.4 42.8 43.8 44.3  Muscle-Mass lbs 31.2 31.1 30.6 30.8  BMI 42.3 40.8 37.9 36.4  Body Fat Displacement             Torso  lbs 64.2 60.8 53.2 50.5         Left Leg  lbs 12.8 12.1 10.6 10.1         Right Leg  lbs 12.8 12.1 10.6 10.1         Left Arm  lbs 6.4 6.0 5.3 5.0         Right Arm   lbs 6.4 6.0 5.3 5.0     Lifestyle & Dietary Hx  Pt states she works 2 jobs working 13-14 hours 4 days a week and then 8 hours one day a week.  Pt states she cooks once a week.  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  Pt states she is not feeling as exhausted at the end of the week like she was.    Estimated daily fluid intake: 64+ oz Estimated daily protein intake: 80 g Supplements: multi and calcium Current average weekly physical activity: Walking at work   24-Hr Dietary Recall First Meal (6 am): coffee Snack (9 am): yogurt and granola or overnight oats + greek yogurt + 2% milk Snack: grapes or apple Second Meal (12 pm): grilled chicken and broccoli and brown rice or  Snack: small bag of pretzels and 1 apple Third Meal: chicken salad croissant (bought at work) or salad (brought from home) or Lunchable with apple or lettuce wrap with [place] and cheese Snack: or rice cake + peanut butter + banana Beverages: water  Post-Op Goals/ Signs/ Symptoms Using straws:  no Drinking while eating: no Chewing/swallowing difficulties: no Changes in vision: no Changes to mood/headaches: no Hair loss/changes to skin/nails: no Difficulty focusing/concentrating: no Sweating: no Limb weakness: no Dizziness/lightheadedness: no Palpitations: no  Carbonated/caffeinated beverages: no N/V/D/C/Gas: no Abdominal pain: no Dumping syndrome: no    NUTRITION DIAGNOSIS  Overweight/obesity ([place]-3.3) related to past poor dietary habits and physical inactivity as evidenced by completed bariatric surgery and following dietary guidelines for continued weight loss and healthy nutrition status.     NUTRITION INTERVENTION Nutrition counseling (C-1) and education (E-2) to facilitate bariatric surgery goals, including: The importance of consuming adequate calories as well as certain nutrients daily due to the body's need for essential vitamins, minerals, and fats The importance of daily physical activity and to reach a goal of at least 150 minutes of moderate to vigorous physical activity weekly (or as directed by their physician) due to benefits such as increased musculature and improved lab values The importance of intuitive eating specifically learning hunger-satiety cues and understanding the importance of learning a new body: The importance of mindful eating to avoid grazing behaviors  Encouraged patient to honor their body's internal hunger and fullness cues.  Throughout the day, check in mentally and rate hunger. Stop eating when satisfied not full regardless of how much food is left on the plate.  Get more if still hungry 20-30 minutes later.  The key is to honor satisfaction so throughout the meal, rate fullness factor and stop when comfortably satisfied not physically full. The key is to honor hunger and fullness without any feelings of guilt or shame.  Pay attention to what the internal cues are, rather than any external factors. This will enhance the confidence you have in  listening to your own body and following those internal cues enabling you to increase how often you eat when you are hungry not out of appetite and stop when you are satisfied not full.  Encouraged pt to continue to eat balanced meals inclusive of non starchy vegetables 2 times a day 7 days a week Encouraged pt to choose lean protein sources: limiting beef, pork, sausage, hotdogs, and lunch meat Encourage pt to choose healthy fats such as plant based limiting animal fats Encouraged pt to continue to drink a minium 64 fluid ounces with half being plain water to satisfy proper hydration  Creation of balanced and diverse meals to increase the intake of nutrient-rich foods that provide essential vitamins, minerals, fiber, and phytonutrients  Variety of Fruits and Vegetables:  Aim for a colorful array of fruits and vegetables to ensure a wide range of nutrients. Include a mix of leafy greens, berries, citrus fruits, cruciferous vegetables, and more. Whole Grains: Choose whole grains over refined grains. Examples include brown rice, quinoa, oats, whole wheat, and barley. Lean Proteins: Include lean sources of protein, such as poultry, fish, tofu, legumes, beans, lentils, and low-fat dairy products. Limit red and processed meats. Healthy Fats: Incorporate sources of healthy fats, including avocados, nuts, seeds, and olive oil. Limit saturated and trans fats found in fried and processed foods. Dairy or Dairy Alternatives: Choose low-fat or fat-free dairy products, or plant-based alternatives like almond or soy milk. Portion Control: Be mindful of portion sizes to avoid overeating. Pay attention to hunger and satisfaction cues. Limit Added Sugars: Minimize the consumption of sugary beverages, snacks, and desserts. Check food labels for added sugars and opt for natural sources of sweetness such as whole fruits. Hydration: Drink plenty of water throughout the day. Limit sugary drinks and excessive  caffeine intake. Moderate Sodium Intake: Reduce the consumption of high-sodium foods. Use herbs and spices for flavor instead of excessive salt. Meal Planning and Preparation: Plan and prepare meals ahead of time to make healthier choices more convenient. Include a mix of food groups in each meal. Limit Processed Foods: Minimize the intake of highly processed and packaged foods that are often high in added sugars, salt, and unhealthy fats. Regular Physical Activity: Combine a healthy diet with regular physical activity for overall well-being. Aim for at least 150 minutes of moderate-intensity aerobic exercise per week, along with strength training. Moderation and Balance: Enjoy treats and indulgent foods in moderation, emphasizing balance rather than strict restriction.  Learning Style & Readiness for Change Teaching method utilized: Visual & Auditory  Demonstrated degree of understanding via: Teach Back  Readiness Level: action Barriers to learning/adherence to lifestyle change: none stated  RD's Notes for Next Visit Assess adherence to pt chosen goals    MONITORING & EVALUATION Dietary intake, weekly physical activity, body weight.  Next Steps Patient is to follow-up in august

## 2023-05-13 ENCOUNTER — Other Ambulatory Visit: Payer: Self-pay | Admitting: Internal Medicine

## 2023-05-13 DIAGNOSIS — E559 Vitamin D deficiency, unspecified: Secondary | ICD-10-CM

## 2023-07-01 ENCOUNTER — Telehealth: Payer: Self-pay

## 2023-07-01 DIAGNOSIS — Z30011 Encounter for initial prescription of contraceptive pills: Secondary | ICD-10-CM

## 2023-07-01 MED ORDER — NORETHINDRONE 0.35 MG PO TABS: 1.0000 | ORAL_TABLET | Freq: Every day | ORAL | 0 refills | Status: DC

## 2023-07-01 NOTE — Telephone Encounter (Signed)
Pt advised and voiced understanding. Rx sent. Recall placed for 26mo f/u. Will route to provider for final review and close.

## 2023-07-01 NOTE — Telephone Encounter (Signed)
Yes, please send micronor and have follow up in 3 months. Thank you.

## 2023-07-01 NOTE — Telephone Encounter (Signed)
-----   Message from CMA Alora B sent at 07/01/2023  3:18 PM EDT ----- Patient is wanting to start OCP states this was discussed back in 02/2023 with JC and states she just needed to let JC know when she finally wanted to start. She would like a phone call or mychart message on the prescription and what option was chosen for her. Made patient aware a med fu might be needed and if so provider would let us know and we would call her back to schedule that.

## 2023-07-01 NOTE — Telephone Encounter (Signed)
Per notes from visit in 02/2023: "POPs for HTN." Ok to inform pt of micronor and ok to send? Also please advise if you want her to have an interval f/u prior to next annual? Thanks.

## 2023-07-04 ENCOUNTER — Other Ambulatory Visit: Payer: Self-pay | Admitting: Internal Medicine

## 2023-07-04 DIAGNOSIS — I10 Essential (primary) hypertension: Secondary | ICD-10-CM

## 2023-07-04 DIAGNOSIS — E1169 Type 2 diabetes mellitus with other specified complication: Secondary | ICD-10-CM

## 2023-07-30 ENCOUNTER — Ambulatory Visit: Payer: 59 | Admitting: Skilled Nursing Facility1

## 2023-08-14 ENCOUNTER — Ambulatory Visit: Payer: 59 | Admitting: Dietician

## 2023-09-19 ENCOUNTER — Emergency Department (HOSPITAL_COMMUNITY)
Admission: EM | Admit: 2023-09-19 | Discharge: 2023-09-19 | Disposition: A | Discharge status: Home / Self Care | Payer: 59 | Attending: Emergency Medicine | Admitting: Emergency Medicine

## 2023-09-19 ENCOUNTER — Encounter (HOSPITAL_COMMUNITY): Payer: Self-pay

## 2023-09-19 ENCOUNTER — Other Ambulatory Visit: Payer: Self-pay | Admitting: Radiology

## 2023-09-19 ENCOUNTER — Other Ambulatory Visit: Payer: Self-pay | Admitting: Internal Medicine

## 2023-09-19 DIAGNOSIS — I10 Essential (primary) hypertension: Secondary | ICD-10-CM

## 2023-09-19 DIAGNOSIS — S39012A Strain of muscle, fascia and tendon of lower back, initial encounter: Secondary | ICD-10-CM | POA: Diagnosis not present

## 2023-09-19 DIAGNOSIS — M545 Low back pain, unspecified: Secondary | ICD-10-CM | POA: Diagnosis present

## 2023-09-19 DIAGNOSIS — E1165 Type 2 diabetes mellitus with hyperglycemia: Secondary | ICD-10-CM

## 2023-09-19 DIAGNOSIS — S39012D Strain of muscle, fascia and tendon of lower back, subsequent encounter: Secondary | ICD-10-CM

## 2023-09-19 DIAGNOSIS — X58XXXA Exposure to other specified factors, initial encounter: Secondary | ICD-10-CM | POA: Diagnosis not present

## 2023-09-19 DIAGNOSIS — Z30011 Encounter for initial prescription of contraceptive pills: Secondary | ICD-10-CM

## 2023-09-19 MED ORDER — LIDOCAINE 5 % EX PTCH
1.0000 | MEDICATED_PATCH | Freq: Once | CUTANEOUS | Status: DC
  Administered 2023-09-19: 1 via TRANSDERMAL
  Filled 2023-09-19: qty 1

## 2023-09-19 MED ORDER — KETOROLAC TROMETHAMINE 15 MG/ML IJ SOLN
15.0000 mg | Freq: Once | INTRAMUSCULAR | Status: AC
  Administered 2023-09-19: 15 mg via INTRAMUSCULAR
  Filled 2023-09-19: qty 1

## 2023-09-19 MED ORDER — HYDROCODONE-ACETAMINOPHEN 5-325 MG PO TABS
1.0000 | ORAL_TABLET | Freq: Once | ORAL | Status: AC
  Administered 2023-09-19: 1 via ORAL
  Filled 2023-09-19: qty 1

## 2023-09-19 NOTE — Telephone Encounter (Signed)
Med refill request:  norethindrone 0.35mg  Last AEX: 03/13/23 Next AEX: n/a Last MMG (if hormonal med) n/a Refill authorized for 1 month, last RF 07/01/23 (needed 3 month follow up, no appointment scheduled).  Sent to provider for review.

## 2023-09-19 NOTE — Discharge Instructions (Signed)
Your evaluated today for a lumbar strain.  Please take the previously prescribed diclofenac and cyclobenzaprine.  You may use lidocaine patches over the affected area if you find it beneficial.  You may also try using heat over the affected area.  Please follow-up with your primary care provider as needed.

## 2023-09-19 NOTE — Telephone Encounter (Signed)
Med refill request: micronor #84 Last AEX: 03/13/23 Next AEX: none scheduled, needed 3 month follow up after last RF 07/01/23--none scheduled Last MMG (if hormonal med) n/a Refill denied for Micronor #84, needs office visit.  Sent refill for #28 early today.  Sent to provider for review.

## 2023-09-19 NOTE — ED Provider Notes (Signed)
[facility] EMERGENCY DEPARTMENT AT [facility] Provider Note   CSN: [id number] Arrival date & time: 09/19/23  0125     History  Chief Complaint  Patient presents with   Back Pain    Alexis Mathews is a 37 y.o. female.  Patient with history of lumbar strains presents to the emergency room complaining of continued lower left-sided back pain which began Thursday morning without known injury.  Patient was seen at the urgent care on Thursday and was given a Toradol injection, prescribed Flexeril and diclofenac, and recommended the usage of pain patches.  Patient states that the Toradol injection wore off fairly quickly as the pain has started to come back.  Patient denies urinary difficulties, fecal incontinence, saddle anesthesia, known injury to the area   Back Pain      Home Medications Prior to Admission medications   Medication Sig Start Date End Date Taking? Authorizing Provider  amLODipine (NORVASC) 5 MG tablet TAKE 1 TABLET(5 MG) BY MOUTH DAILY 07/07/23   Philip Aspen, Limmie Patricia, MD  atorvastatin (LIPITOR) 40 MG tablet TAKE 1 TABLET(40 MG) BY MOUTH DAILY 07/07/23   Philip Aspen, Limmie Patricia, MD  blood glucose meter kit and supplies KIT Dispense based on patient and insurance preference. Use up to four times daily as directed. (FOR ICD-9 250.00, 250.01). Patient not taking: Reported on 03/13/2023 07/19/20   Lynn Ito, MD  blood glucose meter kit and supplies Dispense based on patient and insurance preference. Use up to four times daily as directed. (FOR ICD-10 E10.9, E11.9). Patient not taking: Reported on 03/13/2023 07/19/20   Lynn Ito, MD  norethindrone (ORTHO MICRONOR) 0.35 MG tablet Take 1 tablet (0.35 mg total) by mouth daily. 07/01/23   Chrzanowski, Jami B, NP  valsartan-hydrochlorothiazide (DIOVAN-HCT) 160-25 MG tablet Take 1 tablet by mouth daily. 07/03/22   Philip Aspen, Limmie Patricia, MD      Allergies    Patient has no known allergies.    Review of  Systems   Review of Systems  Musculoskeletal:  Positive for back pain.    Physical Exam Updated Vital Signs BP (!) 167/85 (BP Location: Left Arm)   Pulse (!) 58   Temp 98.1 F (36.7 C) (Oral)   Resp 16   Ht 5\' 3"  (1.6 m)   Wt 85.3 kg   LMP 09/11/2023 (Exact Date)   SpO2 98%   BMI 33.30 kg/m  Physical Exam Vitals and nursing note reviewed.  HENT:     Head: Normocephalic and atraumatic.  Eyes:     Pupils: Pupils are equal, round, and reactive to light.  Pulmonary:     Effort: Pulmonary effort is normal. No respiratory distress.  Musculoskeletal:        General: No signs of injury.     Cervical back: Normal range of motion.     Comments: Negative SLR bilaterally.  Patient with tenderness to palpation over the left lumbar region.  No midline spinal tenderness appreciated  Skin:    General: Skin is dry.  Neurological:     Mental Status: She is alert.     Cranial Nerves: No cranial nerve deficit.  Psychiatric:        Speech: Speech normal.        Behavior: Behavior normal.     ED Results / Procedures / Treatments   Labs (all labs ordered are listed, but only abnormal results are displayed) Labs Reviewed - No data to display  EKG None  Radiology No results found.  Procedures Procedures    Medications Ordered in ED Medications  lidocaine (LIDODERM) 5 % 1 patch (1 patch Transdermal Patch Applied 09/19/23 0419)  ketorolac (TORADOL) 15 MG/ML injection 15 mg (15 mg Intramuscular Given 09/19/23 0419)  HYDROcodone-acetaminophen (NORCO/VICODIN) 5-325 MG per tablet 1 tablet (1 tablet Oral Given 09/19/23 0419)    ED Course/ Medical Decision Making/ A&P                                 Medical Decision Making Risk Prescription drug management.   This patient presents to the ED for concern of back pain, this involves an extensive number of treatment options, and is a complaint that carries with it a high risk of complications and morbidity.  The differential diagnosis  includes muscle strain, fracture, dislocation, nephrolithiasis, others   Co morbidities that complicate the patient evaluation  History of lumbar strain   Additional history obtained:  External records from outside source obtained and reviewed including urgent care notes   Imaging Studies ordered:  No reported trauma, no midline tenderness, no red flag symptoms such as urinary incontinence, urinary retention, fecal incontinence, saddle anesthesia.  No neurodeficit.  No indication at this time for emergent spinal imaging  Problem List / ED Course / Critical interventions / Medication management   I ordered medication including hydrocodone, Toradol, lidocaine patch for pain Reevaluation of the patient after these medicines showed that the patient improved I have reviewed the patients home medicines and have made adjustments as needed   Test / Admission - Considered:  Patient's pain improved with medication.  Symptoms seem consistent with a lumbar strain.  No midline tenderness, no red flag symptoms, no recent trauma to necessitate emergent imaging.  Patient with normal neurologic function/sensation.  Negative SLR bilaterally.  Plan to discharge home at this time with recommendations to continue earlier prescribed medications including Voltaren and Flexeril.  Return precautions provided.         Final Clinical Impression(s) / ED Diagnoses Final diagnoses:  Strain of lumbar region, subsequent encounter    Rx / DC Orders ED Discharge Orders     None         Pamala Duffel 09/19/23 6433    Shon Baton, MD 09/19/23 [phone]

## 2023-09-19 NOTE — ED Triage Notes (Signed)
Pt arrived POV for continued back pain that started yesterday morning w/o injury. Was seen at [facility] yesterday, was given and "injection", pain patches, and muscle relaxer. Pt was told not to take the Voltaren until tomorrow. Reports the Toradol injection has wear off and pain is starting to ease back. Pt placed a pain patch 1.5 hr ago, then removed before shower. Pt was ambulatory to triage w/o difficulty, NAD Noted, A&O x4.

## 2023-09-22 NOTE — Telephone Encounter (Signed)
Metformin The original prescription was discontinued on 04/01/2022 by Philip Aspen, Limmie Patricia, MD for the following reason: Reorder. Renewing this prescription may not be appropriate.

## 2023-10-20 ENCOUNTER — Other Ambulatory Visit: Payer: Self-pay | Admitting: Radiology

## 2023-10-20 DIAGNOSIS — Z30011 Encounter for initial prescription of contraceptive pills: Secondary | ICD-10-CM

## 2023-10-21 NOTE — Telephone Encounter (Signed)
Med refill request: norethindrone 0.35 mg (rx'd 07/01/23, needed 3 month follow up) Last AEX: 03/13/23 Next AEX: none scheduled  Last MMG (if hormonal med) n/a Refill sent to provider for approval or denial.

## 2023-11-19 ENCOUNTER — Other Ambulatory Visit: Payer: Self-pay | Admitting: Radiology

## 2023-11-19 DIAGNOSIS — Z30011 Encounter for initial prescription of contraceptive pills: Secondary | ICD-10-CM

## 2023-11-19 NOTE — Telephone Encounter (Signed)
Med refill request: norethindrone 0.35 mg Last AEX: 03/13/23 Next AEX: none scheduled Last MMG (if hormonal med) n/a Refill sent to provider for approval or denial as appropriate.

## 2023-12-17 ENCOUNTER — Other Ambulatory Visit: Payer: Self-pay | Admitting: Internal Medicine

## 2023-12-17 DIAGNOSIS — I10 Essential (primary) hypertension: Secondary | ICD-10-CM

## 2024-02-19 ENCOUNTER — Encounter (HOSPITAL_COMMUNITY): Payer: Self-pay | Admitting: *Deleted

## 2024-05-03 ENCOUNTER — Other Ambulatory Visit: Payer: Self-pay | Admitting: Radiology

## 2024-05-03 DIAGNOSIS — Z30011 Encounter for initial prescription of contraceptive pills: Secondary | ICD-10-CM

## 2024-05-03 NOTE — Telephone Encounter (Signed)
 Med refill request:norethindrone  0.35 mg tab Last AEX: 03/13/23 -JC Next AEX: Not scheduled  Last MMG (if hormonal med) N/A Refill authorized: Please Advise?  MyChart message  to patient advising AEX needed for additional refills.   Routing to Dr. Colvin Dec to review

## 2024-08-02 ENCOUNTER — Other Ambulatory Visit: Payer: Self-pay | Admitting: Internal Medicine

## 2024-08-02 DIAGNOSIS — E1169 Type 2 diabetes mellitus with other specified complication: Secondary | ICD-10-CM

## 2024-08-02 DIAGNOSIS — I10 Essential (primary) hypertension: Secondary | ICD-10-CM

## 2024-08-04 ENCOUNTER — Other Ambulatory Visit: Payer: Self-pay | Admitting: Internal Medicine

## 2024-08-04 DIAGNOSIS — I10 Essential (primary) hypertension: Secondary | ICD-10-CM

## 2024-08-17 ENCOUNTER — Other Ambulatory Visit: Payer: Self-pay | Admitting: Obstetrics and Gynecology

## 2024-08-17 DIAGNOSIS — Z30011 Encounter for initial prescription of contraceptive pills: Secondary | ICD-10-CM

## 2024-08-17 NOTE — Telephone Encounter (Signed)
 Med refill request: Micronor   Last AEX: 03/13/23 Next AEX: not scheduled message sent to FD Last MMG (if hormonal med)  Refill authorized: last rx 05/03/24 #84 wit 0 refills. Please approve or deny

## 2024-08-18 ENCOUNTER — Telehealth: Payer: Self-pay

## 2024-08-18 NOTE — Telephone Encounter (Signed)
 Patient called requesting a refill of her birth control. Patient is aware her rx was already sent in earlier today.

## 2024-10-08 ENCOUNTER — Ambulatory Visit: Admitting: Radiology

## 2024-10-08 ENCOUNTER — Encounter: Payer: Self-pay | Admitting: Radiology

## 2024-10-08 VITALS — BP 138/82 | Ht 63.25 in | Wt 230.0 lb

## 2024-10-08 DIAGNOSIS — Z01419 Encounter for gynecological examination (general) (routine) without abnormal findings: Secondary | ICD-10-CM | POA: Diagnosis not present

## 2024-10-08 DIAGNOSIS — Z3041 Encounter for surveillance of contraceptive pills: Secondary | ICD-10-CM

## 2024-10-08 DIAGNOSIS — Z1331 Encounter for screening for depression: Secondary | ICD-10-CM

## 2024-10-08 DIAGNOSIS — L732 Hidradenitis suppurativa: Secondary | ICD-10-CM

## 2024-10-08 DIAGNOSIS — Z113 Encounter for screening for infections with a predominantly sexual mode of transmission: Secondary | ICD-10-CM

## 2024-10-08 MED ORDER — NORETHINDRONE 0.35 MG PO TABS: 1.0000 | ORAL_TABLET | Freq: Every day | ORAL | 4 refills | Status: AC

## 2024-10-08 NOTE — Progress Notes (Signed)
 Alexis Mathews 12/12/1986 [id number]   History:  38 y.o. G0 presents for annual exam. Would like full STI panel. No symptoms. Needs refill on POPs. Would like referral to specialist for HS of multiple sites.  Gynecologic History Patient's last menstrual period was 09/01/2024 (approximate). Period Cycle (Days):  (skips periods if not on POP) Period Pattern: (!) Irregular Menstrual Flow: Moderate Menstrual Control: Thin pad, Maxi pad Dysmenorrhea: None Contraception/Family planning: abstinence Sexually active: no   Obstetric History OB History  Gravida Para Term Preterm AB Living  0 0 0 0 0 0  SAB IAB Ectopic Multiple Live Births  0 0 0 0 0      10/08/2024    3:56 PM 02/03/2023    7:18 AM 07/03/2022    3:58 PM  Depression screen PHQ 2/9  Decreased Interest 0 0 0  Down, Depressed, Hopeless 0 0 0  PHQ - 2 Score 0 0 0  Altered sleeping  0 0  Tired, decreased energy  0 0  Change in appetite  0 0  Feeling bad or failure about yourself   0 0  Trouble concentrating  0 0  Moving slowly or fidgety/restless  0 0  Suicidal thoughts  0 0  PHQ-9 Score  0 0  Difficult doing work/chores  Not difficult at all Not difficult at all     The following portions of the patient's history were reviewed and updated as appropriate: allergies, current medications, past family history, past medical history, past social history, past surgical history, and problem list.  Review of Systems Pertinent items noted in HPI and remainder of comprehensive ROS otherwise negative.   Past medical history, past surgical history, family history and social history were all reviewed and documented in the EPIC chart.   Exam:  Vitals:   10/08/24 1553  BP: 138/82  Weight: 230 lb (104.3 kg)  Height: 5' 3.25 (1.607 m)    Body mass index is 40.42 kg/m.  General appearance:  Normal Thyroid:  Symmetrical, normal in size, without palpable masses or nodularity. Respiratory  Auscultation:  Clear without  wheezing or rhonchi Cardiovascular  Auscultation:  Regular rate, without rubs, murmurs or gallops  Edema/varicosities:  Not grossly evident Abdominal  Soft,nontender, without masses, guarding or rebound.  Liver/spleen:  No organomegaly noted  Hernia:  None appreciated  Skin  Inspection:  Grossly normal HS flare under right breast and right axillae. Breasts: Examined lying and sitting.   Right: Without masses, retractions, nipple discharge or axillary adenopathy.   Left: Without masses, retractions, nipple discharge or axillary adenopathy. Genitourinary   Inguinal/mons:  Normal without inguinal adenopathy  External genitalia:  Normal appearing vulva with no masses, tenderness, or lesions  BUS/Urethra/Skene's glands:  Normal without masses or exudate  Vagina:  Normal appearing with normal color and discharge, no lesions  Cervix:  Normal appearing without discharge or lesions  Uterus:  Normal in size, shape and contour.  Mobile, nontender  Adnexa/parametria:     Rt: Normal in size, without masses or tenderness.   Lt: Normal in size, without masses or tenderness.  Anus and perineum: Normal   Darice Hoit, present for exam  Assessment/Plan:   1. Well woman exam with routine gynecological exam (Primary) Pap 2027  2. Screening for STDs (sexually transmitted diseases) - HIV Antibody (routine testing w rflx) - RPR - Hepatitis C antibody - SURESWAB CT/NG/T. vaginalis  3. Oral contraceptive pill surveillance - norethindrone  (MICRONOR ) 0.35 MG tablet; Take 1 tablet (0.35 mg total) by mouth  daily.  Dispense: 84 tablet; Refill: 4  4. Depression screening  5. Hidradenitis suppurativa - Ambulatory referral to Dermatology     Return in 1 year for annual or as needed.   GINETTE COZIER B WHNP-BC 4:07 PM 10/08/2024

## 2024-10-09 LAB — HIV ANTIBODY (ROUTINE TESTING W REFLEX)
HIV 1&2 Ab, 4th Generation: NONREACTIVE
HIV FINAL INTERPRETATION: NEGATIVE

## 2024-10-09 LAB — SURESWAB CT/NG/T. VAGINALIS
C. trachomatis RNA, TMA: NOT DETECTED
N. gonorrhoeae RNA, TMA: NOT DETECTED
Trichomonas vaginalis RNA: NOT DETECTED

## 2024-10-09 LAB — RPR: RPR Ser Ql: NONREACTIVE

## 2024-10-09 LAB — HEPATITIS C ANTIBODY: Hepatitis C Ab: NONREACTIVE

## 2024-10-12 ENCOUNTER — Ambulatory Visit: Payer: Self-pay | Admitting: Radiology

## 2025-05-24 ENCOUNTER — Ambulatory Visit: Admitting: Dermatology
